# Patient Record
Sex: Male | Born: 1955 | Race: White | Hispanic: No | Marital: Married | Smoking: Never smoker
Health system: Southern US, Community
[De-identification: ages and names within clinical notes are randomized; demographics above are authoritative.]

## PROBLEM LIST (undated history)

## (undated) DIAGNOSIS — Z79899 Other long term (current) drug therapy: Secondary | ICD-10-CM

## (undated) DIAGNOSIS — G629 Polyneuropathy, unspecified: Secondary | ICD-10-CM

## (undated) DIAGNOSIS — M150 Primary generalized (osteo)arthritis: Secondary | ICD-10-CM

## (undated) DIAGNOSIS — M48061 Spinal stenosis, lumbar region without neurogenic claudication: Secondary | ICD-10-CM

## (undated) DIAGNOSIS — R5383 Other fatigue: Secondary | ICD-10-CM

## (undated) DIAGNOSIS — D72829 Elevated white blood cell count, unspecified: Secondary | ICD-10-CM

## (undated) DIAGNOSIS — Z6841 Body Mass Index (BMI) 40.0 and over, adult: Secondary | ICD-10-CM

## (undated) DIAGNOSIS — R7309 Other abnormal glucose: Secondary | ICD-10-CM

## (undated) DIAGNOSIS — I2510 Atherosclerotic heart disease of native coronary artery without angina pectoris: Principal | ICD-10-CM

## (undated) DIAGNOSIS — M1A9XX1 Chronic gout, unspecified, with tophus (tophi): Secondary | ICD-10-CM

## (undated) DIAGNOSIS — K828 Other specified diseases of gallbladder: Secondary | ICD-10-CM

## (undated) DIAGNOSIS — R945 Abnormal results of liver function studies: Secondary | ICD-10-CM

## (undated) DIAGNOSIS — R5381 Other malaise: Secondary | ICD-10-CM

## (undated) DIAGNOSIS — E782 Mixed hyperlipidemia: Secondary | ICD-10-CM

## (undated) DIAGNOSIS — N400 Enlarged prostate without lower urinary tract symptoms: Secondary | ICD-10-CM

## (undated) DIAGNOSIS — K2970 Gastritis, unspecified, without bleeding: Secondary | ICD-10-CM

## (undated) DIAGNOSIS — N181 Chronic kidney disease, stage 1: Secondary | ICD-10-CM

## (undated) DIAGNOSIS — I10 Essential (primary) hypertension: Secondary | ICD-10-CM

## (undated) DIAGNOSIS — Q248 Other specified congenital malformations of heart: Secondary | ICD-10-CM

## (undated) DIAGNOSIS — M1990 Unspecified osteoarthritis, unspecified site: Secondary | ICD-10-CM

## (undated) DIAGNOSIS — K2210 Ulcer of esophagus without bleeding: Secondary | ICD-10-CM

## (undated) HISTORY — DX: Other specified congenital malformations of heart: Q24.8

## (undated) HISTORY — PX: WRIST SURGERY: SHX841

## (undated) HISTORY — DX: Other specified diseases of gallbladder: K82.8

## (undated) HISTORY — DX: Morbid (severe) obesity due to excess calories: E66.01

## (undated) HISTORY — DX: Ulcer of esophagus without bleeding: K22.10

## (undated) HISTORY — DX: Other abnormal glucose: R73.09

## (undated) HISTORY — DX: Body Mass Index (BMI) 40.0 and over, adult: Z684

## (undated) HISTORY — DX: Essential (primary) hypertension: I10

## (undated) HISTORY — DX: Other long term (current) drug therapy: Z79.899

## (undated) HISTORY — DX: Spinal stenosis, lumbar region without neurogenic claudication: M48.061

## (undated) HISTORY — DX: Atherosclerotic heart disease of native coronary artery without angina pectoris: I25.10

## (undated) HISTORY — DX: Other malaise: R53.81

## (undated) HISTORY — DX: Gastritis, unspecified, without bleeding: K29.70

## (undated) HISTORY — DX: Abnormal results of liver function studies: R94.5

## (undated) HISTORY — PX: CARPAL TUNNEL RELEASE: SHX101

## (undated) HISTORY — DX: Elevated white blood cell count, unspecified: D72.829

## (undated) HISTORY — DX: Benign prostatic hyperplasia without lower urinary tract symptoms: N40.0

## (undated) HISTORY — DX: Chronic gout, unspecified, with tophus (tophi): M1A.9XX1

## (undated) HISTORY — PX: CATARACT EXTRACTION W/ INTRAOCULAR LENS  IMPLANT, BILATERAL: SHX1307

## (undated) HISTORY — DX: Primary generalized (osteo)arthritis: M15.0

## (undated) HISTORY — DX: Mixed hyperlipidemia: E78.2

## (undated) HISTORY — DX: Other fatigue: R53.83

## (undated) HISTORY — PX: KNEE ARTHROSCOPY: SUR90

## (undated) HISTORY — DX: Chronic kidney disease, stage 1: N18.1

## (undated) HISTORY — DX: Polyneuropathy, unspecified: G62.9

## (undated) HISTORY — PX: TONSILLECTOMY: SUR1361

## (undated) HISTORY — PX: BACK SURGERY: SHX140

---

## 2012-08-22 ENCOUNTER — Other Ambulatory Visit: Payer: Self-pay | Admitting: Neurosurgery

## 2012-08-28 ENCOUNTER — Encounter (HOSPITAL_COMMUNITY)
Admission: RE | Admit: 2012-08-28 | Discharge: 2012-08-28 | Disposition: A | Discharge status: Home / Self Care | Payer: BC Managed Care – PPO | Source: Ambulatory Visit | Attending: Neurosurgery | Admitting: Neurosurgery

## 2012-08-28 ENCOUNTER — Encounter (HOSPITAL_COMMUNITY): Payer: Self-pay

## 2012-08-28 ENCOUNTER — Encounter (HOSPITAL_COMMUNITY)
Admission: RE | Admit: 2012-08-28 | Discharge: 2012-08-28 | Disposition: A | Discharge status: Home / Self Care | Payer: BC Managed Care – PPO | Source: Ambulatory Visit | Attending: Anesthesiology | Admitting: Anesthesiology

## 2012-08-28 HISTORY — DX: Unspecified osteoarthritis, unspecified site: M19.90

## 2012-08-28 LAB — CBC WITH DIFFERENTIAL/PLATELET
Basophils Absolute: 0 10*3/uL (ref 0.0–0.1)
Basophils Relative: 0 % (ref 0–1)
HCT: 52.5 % — ABNORMAL HIGH (ref 39.0–52.0)
Hemoglobin: 18.4 g/dL — ABNORMAL HIGH (ref 13.0–17.0)
Lymphocytes Relative: 24 % (ref 12–46)
MCHC: 35 g/dL (ref 30.0–36.0)
Monocytes Absolute: 0.6 10*3/uL (ref 0.1–1.0)
Monocytes Relative: 7 % (ref 3–12)
Neutro Abs: 5.5 10*3/uL (ref 1.7–7.7)
Neutrophils Relative %: 66 % (ref 43–77)
RBC: 5.7 MIL/uL (ref 4.22–5.81)
WBC: 8.4 10*3/uL (ref 4.0–10.5)

## 2012-08-28 LAB — BASIC METABOLIC PANEL
BUN: 22 mg/dL (ref 6–23)
CO2: 27 mEq/L (ref 19–32)
Chloride: 102 mEq/L (ref 96–112)
Glucose, Bld: 102 mg/dL — ABNORMAL HIGH (ref 70–99)
Potassium: 4.4 mEq/L (ref 3.5–5.1)
Sodium: 138 mEq/L (ref 135–145)

## 2012-08-28 LAB — TYPE AND SCREEN: Antibody Screen: NEGATIVE

## 2012-08-28 NOTE — Pre-Procedure Instructions (Signed)
[name]  08/28/2012   Your procedure is scheduled on: Monday  09/03/12   Report to [facility] at 530 AM.  Call this number if you have problems the morning of surgery: [phone]   Remember:   Do not eat food  Or drink :After Midnight.    Take these medicines the morning of surgery with A SIP OF WATER:  Allopurinol, colchicine   Do not wear jewelry, make-up or nail polish.  Do not wear lotions, powders, or perfumes. You may wear deodorant.  Do not shave 48 hours prior to surgery. Men may shave face and neck.  Do not bring valuables to the hospital.  Contacts, dentures or bridgework may not be worn into surgery.  Leave suitcase in the car. After surgery it may be brought to your room.  For patients admitted to the hospital, checkout time is 11:00 AM the day of discharge.   Patients discharged the day of surgery will not be allowed to drive home.  Name and phone number of your driver:   Special Instructions: Shower using CHG 2 nights before surgery and the night before surgery.  If you shower the day of surgery use CHG.  Use special wash - you have one bottle of CHG for all showers.  You should use approximately 1/3 of the bottle for each shower.   Please read over the following fact sheets that you were given: Pain Booklet, Coughing and Deep Breathing, Blood Transfusion Information, MRSA Information and Surgical Site Infection Prevention

## 2012-08-28 NOTE — Progress Notes (Signed)
FAXED APNEA FORM TO PATIENT'S PCP

## 2012-08-28 NOTE — Progress Notes (Signed)
[facility] TO REVIEW LABS. 

## 2012-08-29 NOTE — Consult Note (Signed)
Anesthesia chart review: Patient is a 56 year old male scheduled for L3-4 decompressive lumbar laminectomy by Dr. Jordan Likes on 09/03/2012. History includes nonsmoker, obesity (BMI 40), HTN, arthritis.   Labs noted.  H/H 18.4/52.5.  PLT 182.  Cr 1.28, glucose 102.  Chest x-ray on 08/28/2012 showed no acute cardiopulmonary disease.  EKG on 08/28/2012 showed normal sinus rhythm.  His OSA screening tool was 5 (> 4 indicates an elevated risk.)  This form was faxed to his PCP at [facility] in [place] by his PAT RN.  Shonna Chock, PA-C

## 2012-09-02 MED ORDER — DEXTROSE 5 % IV SOLN
3.0000 g | INTRAVENOUS | Status: AC
  Administered 2012-09-03: 3 g via INTRAVENOUS
  Filled 2012-09-02 (×2): qty 3000

## 2012-09-02 MED ORDER — CEFAZOLIN SODIUM-DEXTROSE 2-3 GM-% IV SOLR: 2.0000 g | INTRAVENOUS | Status: DC

## 2012-09-03 ENCOUNTER — Ambulatory Visit (HOSPITAL_COMMUNITY): Payer: BC Managed Care – PPO | Admitting: Vascular Surgery

## 2012-09-03 ENCOUNTER — Ambulatory Visit (HOSPITAL_COMMUNITY)
Admission: RE | Admit: 2012-09-03 | Discharge: 2012-09-03 | Disposition: A | Discharge status: Home / Self Care | Payer: BC Managed Care – PPO | Source: Ambulatory Visit | Attending: Neurosurgery | Admitting: Neurosurgery

## 2012-09-03 ENCOUNTER — Encounter (HOSPITAL_COMMUNITY): Payer: Self-pay | Admitting: *Deleted

## 2012-09-03 ENCOUNTER — Encounter (HOSPITAL_COMMUNITY)
Admission: RE | Disposition: A | Discharge status: Home / Self Care | Payer: Self-pay | Source: Ambulatory Visit | Attending: Neurosurgery

## 2012-09-03 ENCOUNTER — Ambulatory Visit (HOSPITAL_COMMUNITY): Payer: BC Managed Care – PPO

## 2012-09-03 ENCOUNTER — Encounter (HOSPITAL_COMMUNITY): Payer: Self-pay | Admitting: Vascular Surgery

## 2012-09-03 ENCOUNTER — Encounter (HOSPITAL_COMMUNITY): Payer: Self-pay | Admitting: Neurosurgery

## 2012-09-03 DIAGNOSIS — Z01810 Encounter for preprocedural cardiovascular examination: Secondary | ICD-10-CM | POA: Insufficient documentation

## 2012-09-03 DIAGNOSIS — M48061 Spinal stenosis, lumbar region without neurogenic claudication: Secondary | ICD-10-CM | POA: Diagnosis present

## 2012-09-03 DIAGNOSIS — Z01812 Encounter for preprocedural laboratory examination: Secondary | ICD-10-CM | POA: Insufficient documentation

## 2012-09-03 DIAGNOSIS — M1990 Unspecified osteoarthritis, unspecified site: Secondary | ICD-10-CM | POA: Insufficient documentation

## 2012-09-03 DIAGNOSIS — I10 Essential (primary) hypertension: Secondary | ICD-10-CM | POA: Insufficient documentation

## 2012-09-03 DIAGNOSIS — M48062 Spinal stenosis, lumbar region with neurogenic claudication: Secondary | ICD-10-CM | POA: Insufficient documentation

## 2012-09-03 HISTORY — DX: Spinal stenosis, lumbar region without neurogenic claudication: M48.061

## 2012-09-03 HISTORY — PX: LUMBAR LAMINECTOMY/DECOMPRESSION MICRODISCECTOMY: SHX5026

## 2012-09-03 SURGERY — LUMBAR LAMINECTOMY/DECOMPRESSION MICRODISCECTOMY 1 LEVEL
Anesthesia: General | Site: Back | Wound class: Clean

## 2012-09-03 MED ORDER — PHENYLEPHRINE HCL 10 MG/ML IJ SOLN
10.0000 mg | INTRAVENOUS | Status: DC | PRN
  Administered 2012-09-03: 25 ug/min via INTRAVENOUS

## 2012-09-03 MED ORDER — KETOROLAC TROMETHAMINE 30 MG/ML IJ SOLN
INTRAMUSCULAR | Status: DC | PRN
  Administered 2012-09-03: 30 mg via INTRAVENOUS

## 2012-09-03 MED ORDER — DEXAMETHASONE SODIUM PHOSPHATE 10 MG/ML IJ SOLN
INTRAMUSCULAR | Status: DC | PRN
  Administered 2012-09-03: 8 mg via INTRAVENOUS

## 2012-09-03 MED ORDER — HYDROMORPHONE HCL PF 1 MG/ML IJ SOLN
INTRAMUSCULAR | Status: AC
  Filled 2012-09-03: qty 1

## 2012-09-03 MED ORDER — MENTHOL 3 MG MT LOZG: 1.0000 | LOZENGE | OROMUCOSAL | Status: DC | PRN

## 2012-09-03 MED ORDER — LIDOCAINE HCL 4 % MT SOLN
OROMUCOSAL | Status: DC | PRN
  Administered 2012-09-03: 4 mL via TOPICAL

## 2012-09-03 MED ORDER — PROMETHAZINE HCL 25 MG/ML IJ SOLN: 6.2500 mg | INTRAMUSCULAR | Status: DC | PRN

## 2012-09-03 MED ORDER — COLCHICINE 0.6 MG PO TABS: 0.6000 mg | ORAL_TABLET | Freq: Every day | ORAL | Status: DC

## 2012-09-03 MED ORDER — HYDROMORPHONE HCL PF 1 MG/ML IJ SOLN
0.2500 mg | INTRAMUSCULAR | Status: DC | PRN
  Administered 2012-09-03: 0.5 mg via INTRAVENOUS

## 2012-09-03 MED ORDER — 0.9 % SODIUM CHLORIDE (POUR BTL) OPTIME
TOPICAL | Status: DC | PRN
  Administered 2012-09-03: 1000 mL

## 2012-09-03 MED ORDER — ACETAMINOPHEN 650 MG RE SUPP: 650.0000 mg | RECTAL | Status: DC | PRN

## 2012-09-03 MED ORDER — CEFAZOLIN SODIUM 1-5 GM-% IV SOLN
1.0000 g | Freq: Three times a day (TID) | INTRAVENOUS | Status: DC
  Administered 2012-09-03: 1 g via INTRAVENOUS
  Filled 2012-09-03 (×2): qty 50

## 2012-09-03 MED ORDER — HYDROCODONE-ACETAMINOPHEN 5-325 MG PO TABS: 1.0000 | ORAL_TABLET | ORAL | Status: DC | PRN

## 2012-09-03 MED ORDER — LACTATED RINGERS IV SOLN
INTRAVENOUS | Status: DC | PRN
  Administered 2012-09-03 (×2): via INTRAVENOUS

## 2012-09-03 MED ORDER — MIDAZOLAM HCL 5 MG/5ML IJ SOLN
INTRAMUSCULAR | Status: DC | PRN
  Administered 2012-09-03: 2 mg via INTRAVENOUS

## 2012-09-03 MED ORDER — NEOSTIGMINE METHYLSULFATE 1 MG/ML IJ SOLN
INTRAMUSCULAR | Status: DC | PRN
  Administered 2012-09-03: 3 mg via INTRAVENOUS

## 2012-09-03 MED ORDER — GLYCOPYRROLATE 0.2 MG/ML IJ SOLN
INTRAMUSCULAR | Status: DC | PRN
  Administered 2012-09-03: 0.4 mg via INTRAVENOUS

## 2012-09-03 MED ORDER — PHENOL 1.4 % MT LIQD: 1.0000 | OROMUCOSAL | Status: DC | PRN

## 2012-09-03 MED ORDER — PROPOFOL 10 MG/ML IV BOLUS
INTRAVENOUS | Status: DC | PRN
  Administered 2012-09-03: 200 mg via INTRAVENOUS

## 2012-09-03 MED ORDER — SENNA 8.6 MG PO TABS: 1.0000 | ORAL_TABLET | Freq: Two times a day (BID) | ORAL | Status: DC

## 2012-09-03 MED ORDER — DEXTROSE 5 % IV SOLN
INTRAVENOUS | Status: DC | PRN
  Administered 2012-09-03: 08:00:00 via INTRAVENOUS

## 2012-09-03 MED ORDER — ARTIFICIAL TEARS OP OINT
TOPICAL_OINTMENT | OPHTHALMIC | Status: DC | PRN
  Administered 2012-09-03: 1 via OPHTHALMIC

## 2012-09-03 MED ORDER — ROCURONIUM BROMIDE 100 MG/10ML IV SOLN
INTRAVENOUS | Status: DC | PRN
  Administered 2012-09-03: 20 mg via INTRAVENOUS
  Administered 2012-09-03: 50 mg via INTRAVENOUS
  Administered 2012-09-03: 5 mg via INTRAVENOUS

## 2012-09-03 MED ORDER — FENTANYL CITRATE 0.05 MG/ML IJ SOLN
INTRAMUSCULAR | Status: DC | PRN
  Administered 2012-09-03: 150 ug via INTRAVENOUS

## 2012-09-03 MED ORDER — DEXAMETHASONE SODIUM PHOSPHATE 10 MG/ML IJ SOLN: 10.0000 mg | INTRAMUSCULAR | Status: DC

## 2012-09-03 MED ORDER — SODIUM CHLORIDE 0.9 % IJ SOLN: 3.0000 mL | INTRAMUSCULAR | Status: DC | PRN

## 2012-09-03 MED ORDER — ALUM & MAG HYDROXIDE-SIMETH 200-200-20 MG/5ML PO SUSP: 30.0000 mL | Freq: Four times a day (QID) | ORAL | Status: DC | PRN

## 2012-09-03 MED ORDER — HYDROCODONE-ACETAMINOPHEN 5-325 MG PO TABS
1.0000 | ORAL_TABLET | ORAL | Status: DC | PRN
  Administered 2012-09-03: 2 via ORAL
  Filled 2012-09-03: qty 2

## 2012-09-03 MED ORDER — CYCLOBENZAPRINE HCL 10 MG PO TABS: 10.0000 mg | ORAL_TABLET | Freq: Three times a day (TID) | ORAL | Status: DC | PRN

## 2012-09-03 MED ORDER — SODIUM CHLORIDE 0.9 % IR SOLN
Status: DC | PRN
  Administered 2012-09-03: 07:00:00

## 2012-09-03 MED ORDER — CYCLOBENZAPRINE HCL 10 MG PO TABS
10.0000 mg | ORAL_TABLET | Freq: Three times a day (TID) | ORAL | Status: DC | PRN
  Filled 2012-09-03: qty 1

## 2012-09-03 MED ORDER — ONDANSETRON HCL 4 MG/2ML IJ SOLN
4.0000 mg | INTRAMUSCULAR | Status: DC | PRN
  Filled 2012-09-03: qty 2

## 2012-09-03 MED ORDER — HYDROMORPHONE HCL PF 1 MG/ML IJ SOLN
0.5000 mg | INTRAMUSCULAR | Status: DC | PRN
  Administered 2012-09-03: 1 mg via INTRAVENOUS
  Filled 2012-09-03: qty 1

## 2012-09-03 MED ORDER — BUPIVACAINE HCL (PF) 0.25 % IJ SOLN
INTRAMUSCULAR | Status: DC | PRN
  Administered 2012-09-03: 20 mL

## 2012-09-03 MED ORDER — SODIUM CHLORIDE 0.9 % IJ SOLN
3.0000 mL | Freq: Two times a day (BID) | INTRAMUSCULAR | Status: DC
  Administered 2012-09-03: 3 mL via INTRAVENOUS

## 2012-09-03 MED ORDER — ACETAMINOPHEN 325 MG PO TABS: 650.0000 mg | ORAL_TABLET | ORAL | Status: DC | PRN

## 2012-09-03 MED ORDER — KETOROLAC TROMETHAMINE 30 MG/ML IJ SOLN
30.0000 mg | Freq: Four times a day (QID) | INTRAMUSCULAR | Status: DC
  Administered 2012-09-03: 30 mg via INTRAVENOUS
  Filled 2012-09-03: qty 1

## 2012-09-03 MED ORDER — ONDANSETRON HCL 4 MG/2ML IJ SOLN
INTRAMUSCULAR | Status: DC | PRN
  Administered 2012-09-03: 4 mg via INTRAVENOUS

## 2012-09-03 MED ORDER — THROMBIN 5000 UNITS EX SOLR
CUTANEOUS | Status: DC | PRN
  Administered 2012-09-03 (×2): 5000 [IU] via TOPICAL

## 2012-09-03 MED ORDER — PHENYLEPHRINE HCL 10 MG/ML IJ SOLN
INTRAMUSCULAR | Status: DC | PRN
  Administered 2012-09-03: 80 ug via INTRAVENOUS
  Administered 2012-09-03 (×4): 120 ug via INTRAVENOUS

## 2012-09-03 MED ORDER — LISINOPRIL 40 MG PO TABS
40.0000 mg | ORAL_TABLET | Freq: Every day | ORAL | Status: DC
  Administered 2012-09-03: 40 mg via ORAL
  Filled 2012-09-03: qty 1

## 2012-09-03 MED ORDER — ALLOPURINOL 300 MG PO TABS: 600.0000 mg | ORAL_TABLET | Freq: Every day | ORAL | Status: DC

## 2012-09-03 MED ORDER — SODIUM CHLORIDE 0.9 % IV SOLN
250.0000 mL | INTRAVENOUS | Status: DC
  Administered 2012-09-03: 250 mL via INTRAVENOUS

## 2012-09-03 MED ORDER — ZOLPIDEM TARTRATE 5 MG PO TABS: 5.0000 mg | ORAL_TABLET | Freq: Every evening | ORAL | Status: DC | PRN

## 2012-09-03 MED ORDER — HEMOSTATIC AGENTS (NO CHARGE) OPTIME
TOPICAL | Status: DC | PRN
  Administered 2012-09-03: 1 via TOPICAL

## 2012-09-03 SURGICAL SUPPLY — 48 items
BAG DECANTER FOR FLEXI CONT (MISCELLANEOUS) ×2 IMPLANT
BENZOIN TINCTURE PRP APPL 2/3 (GAUZE/BANDAGES/DRESSINGS) ×2 IMPLANT
BLADE SURG ROTATE 9660 (MISCELLANEOUS) IMPLANT
BRUSH SCRUB EZ PLAIN DRY (MISCELLANEOUS) ×2 IMPLANT
BUR CUTTER 7.0 ROUND (BURR) ×2 IMPLANT
CANISTER SUCTION 2500CC (MISCELLANEOUS) ×2 IMPLANT
CLOTH BEACON ORANGE TIMEOUT ST (SAFETY) ×2 IMPLANT
CONT SPEC 4OZ CLIKSEAL STRL BL (MISCELLANEOUS) ×2 IMPLANT
DECANTER SPIKE VIAL GLASS SM (MISCELLANEOUS) ×2 IMPLANT
DERMABOND ADVANCED (GAUZE/BANDAGES/DRESSINGS) ×1
DERMABOND ADVANCED .7 DNX12 (GAUZE/BANDAGES/DRESSINGS) ×1 IMPLANT
DRAPE LAPAROTOMY 100X72X124 (DRAPES) ×2 IMPLANT
DRAPE MICROSCOPE ZEISS OPMI (DRAPES) ×2 IMPLANT
DRAPE POUCH INSTRU U-SHP 10X18 (DRAPES) ×2 IMPLANT
DRAPE PROXIMA HALF (DRAPES) IMPLANT
DRAPE SURG 17X23 STRL (DRAPES) ×4 IMPLANT
ELECT REM PT RETURN 9FT ADLT (ELECTROSURGICAL) ×2
ELECTRODE REM PT RTRN 9FT ADLT (ELECTROSURGICAL) ×1 IMPLANT
EVACUATOR 1/8 PVC DRAIN (DRAIN) ×2 IMPLANT
GAUZE SPONGE 4X4 16PLY XRAY LF (GAUZE/BANDAGES/DRESSINGS) IMPLANT
GLOVE BIOGEL PI IND STRL 7.0 (GLOVE) ×1 IMPLANT
GLOVE BIOGEL PI INDICATOR 7.0 (GLOVE) ×1
GLOVE ECLIPSE 8.5 STRL (GLOVE) ×2 IMPLANT
GLOVE EXAM NITRILE LRG STRL (GLOVE) IMPLANT
GLOVE EXAM NITRILE MD LF STRL (GLOVE) ×2 IMPLANT
GLOVE EXAM NITRILE XL STR (GLOVE) IMPLANT
GLOVE EXAM NITRILE XS STR PU (GLOVE) IMPLANT
GLOVE OPTIFIT SS 6.5 STRL BRWN (GLOVE) ×6 IMPLANT
GOWN BRE IMP SLV AUR LG STRL (GOWN DISPOSABLE) ×2 IMPLANT
GOWN BRE IMP SLV AUR XL STRL (GOWN DISPOSABLE) ×2 IMPLANT
GOWN STRL REIN 2XL LVL4 (GOWN DISPOSABLE) IMPLANT
KIT BASIN OR (CUSTOM PROCEDURE TRAY) ×2 IMPLANT
KIT ROOM TURNOVER OR (KITS) ×2 IMPLANT
NEEDLE HYPO 22GX1.5 SAFETY (NEEDLE) ×2 IMPLANT
NEEDLE SPNL 22GX3.5 QUINCKE BK (NEEDLE) ×2 IMPLANT
NS IRRIG 1000ML POUR BTL (IV SOLUTION) ×2 IMPLANT
PACK LAMINECTOMY NEURO (CUSTOM PROCEDURE TRAY) ×2 IMPLANT
PAD ARMBOARD 7.5X6 YLW CONV (MISCELLANEOUS) ×6 IMPLANT
RUBBERBAND STERILE (MISCELLANEOUS) ×4 IMPLANT
SPONGE GAUZE 4X4 12PLY (GAUZE/BANDAGES/DRESSINGS) ×2 IMPLANT
SPONGE SURGIFOAM ABS GEL SZ50 (HEMOSTASIS) ×2 IMPLANT
STRIP CLOSURE SKIN 1/2X4 (GAUZE/BANDAGES/DRESSINGS) ×2 IMPLANT
SUT VIC AB 2-0 CT1 18 (SUTURE) ×2 IMPLANT
SUT VIC AB 3-0 SH 8-18 (SUTURE) ×2 IMPLANT
SYR 20ML ECCENTRIC (SYRINGE) ×2 IMPLANT
TOWEL OR 17X24 6PK STRL BLUE (TOWEL DISPOSABLE) ×2 IMPLANT
TOWEL OR 17X26 10 PK STRL BLUE (TOWEL DISPOSABLE) ×2 IMPLANT
WATER STERILE IRR 1000ML POUR (IV SOLUTION) ×2 IMPLANT

## 2012-09-03 NOTE — Op Note (Signed)
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  Asst. Surgeon: None  Anesthesia: General  Indication: 56 year old male with back and bilateral lower chamois pain consistent with neurogenic claudication failing conservative measure workup demonstrates evidence of significant stenosis at L3-4 secondary to spondylosis. Patient presents now for L3 for decompressive surgery in hopes of improving his symptoms.  Operative note: After induction anesthesia patient positioned prone on the Wilson frame and appropriate padded. Lumbar region prepped and draped. Incision made and dissection carried down in a subperiosteal fashion exposing what was felt to be the L3-4 level. X-rays taken in fact the L4-5 level was exposed. Dissection was redirected 1 level cephalad. Retractor was placed. Bilateral decompressive laminotomies and foraminotomies were then performed using the high-speed drill and Kerrison rongeurs to remove the inferior aspect of lamina of L3 medial aspect the L3-4 facet joint superior rim of L4 l flavum was elevated and resected so fashion. Wide decompressive foraminotomies and portal along the course exiting L3 and L4 nerve roots bilaterally. At this point a very thorough decompression been achieved. There is no his injury to thecal sac and nerve roots. Wound is then irrigated and bike solution. Gelfoam was placed topically for hemostasis. A medium Hemovac drain was left in the epidural space. Wounds and close in layers with Vicryl sutures. Steri-Strips sterile dressing were applied. There were no apparent complications. Patient tolerated the procedure well and returned to the recovery room postop.

## 2012-09-03 NOTE — Anesthesia Preprocedure Evaluation (Addendum)
Anesthesia Evaluation  Patient identified by MRN, date of birth, ID band Patient awake    Reviewed: Allergy & Precautions, H&P , NPO status , Patient's Chart, lab work & pertinent test results  Airway Mallampati: III TM Distance: >3 FB Neck ROM: Full    Dental  (+) Teeth Intact and Dental Advisory Given   Pulmonary neg pulmonary ROS,   Findings: Lungs are hypoinflated but clear.  Cardiomediastinal silhouette is within normal.  There is spondylosis of the spine.   IMPRESSION: No acute cardiopulmonary disease.  08/28/12   Pulmonary exam normal       Cardiovascular Exercise Tolerance: Good hypertension, Pt. on medications Rhythm:Regular Rate:Normal  28-Aug-2012 08:57:03  [facility] ROUTINE RECORD Normal sinus rhythm Normal ECG No previous tracing   Neuro/Psych negative neurological ROS  negative psych ROS   GI/Hepatic negative GI ROS, Neg liver ROS,   Endo/Other  negative endocrine ROSMorbid obesity  Renal/GU negative Renal ROS     Musculoskeletal  (+) Arthritis -, Osteoarthritis,    Abdominal   Peds  Hematology negative hematology ROS (+)   Anesthesia Other Findings Thick Neck.  Morbidly Obese  Reproductive/Obstetrics                       Anesthesia Physical Anesthesia Plan  ASA: III  Anesthesia Plan: General   Post-op Pain Management:    Induction: Intravenous  Airway Management Planned: Oral ETT  Additional Equipment:   Intra-op Plan:   Post-operative Plan: Extubation in OR  Informed Consent: I have reviewed the patients History and Physical, chart, labs and discussed the procedure including the risks, benefits and alternatives for the proposed anesthesia with the patient or authorized representative who has indicated his/her understanding and acceptance.   Dental advisory given  Plan Discussed with: CRNA, Anesthesiologist and Surgeon  Anesthesia Plan  Comments:         Anesthesia Quick Evaluation

## 2012-09-03 NOTE — Discharge Summary (Signed)
Physician Discharge Summary  Patient ID: Miguel Hamilton MRN: [id number] DOB/AGE: 1956/05/17 56 y.o.  Admit date: 09/03/2012 Discharge date: 09/03/2012  Admission Diagnoses:  Discharge Diagnoses:  Principal Problem:  *Lumbar stenosis with neurogenic claudication   Discharged Condition: good  Hospital Course: Admitted to the hospital where he underwent uncomplicated bilateral L3-4 decompressive laminotomies and foraminotomies done very well. Back and lower extremity pain are much improved. Strength and sensation stable . Wound is healing well. Plan for discharge home.  Consults:   Significant Diagnostic Studies:   Treatments:   Discharge Exam: Blood pressure 130/83, pulse 83, temperature 97.7 F (36.5 C), temperature source Oral, resp. rate 18, SpO2 96.00%. Awake and alert oriented appropriate her cranial nerve function is intact. Motor examination with chronic right-sided dorsiflexion weakness which is improved from preop. Sensory examination stable. Wound clean dry and intact. Chest and abdomen benign.  Disposition: Final discharge disposition not confirmed     Medication List     As of 09/03/2012  5:32 PM    TAKE these medications         allopurinol 300 MG tablet   Commonly known as: ZYLOPRIM   Take 600 mg by mouth daily.      colchicine 0.6 MG tablet   Take 0.6 mg by mouth daily.      cyclobenzaprine 10 MG tablet   Commonly known as: FLEXERIL   Take 1 tablet (10 mg total) by mouth 3 (three) times daily as needed for muscle spasms.      HYDROcodone-acetaminophen 5-325 MG per tablet   Commonly known as: NORCO/VICODIN   Take 1-2 tablets by mouth every 4 (four) hours as needed.      lisinopril 40 MG tablet   Commonly known as: PRINIVIL,ZESTRIL   Take 40 mg by mouth daily.      MAGNESIUM PO   Take 1,000 mg by mouth daily.      naproxen 500 MG tablet   Commonly known as: NAPROSYN   Take 500 mg by mouth 2 (two) times daily with a meal.      vitamin B-12 1000  MCG tablet   Commonly known as: CYANOCOBALAMIN   Take 1,000 mcg by mouth daily.           Follow-up Information    Follow up with Miamarie Moll A, MD. Call in 1 week. (Ask for Lurena Joiner)    Contact information:   [address] [place] [address] [phone]          Signed: Temple Pacini 09/03/2012, 5:32 PM

## 2012-09-03 NOTE — Preoperative (Signed)
Beta Blockers   Reason not to administer Beta Blockers:Not Applicable 

## 2012-09-03 NOTE — H&P (Signed)
Miguel Hamilton is an 56 y.o. male.   Chief Complaint: Back and bilateral leg pain HPI: 56 year old male with severe back and bilateral lower trimming pain consistent with neurogenic claudication presents for lumbar decompressive surgery. Symptoms have been progressive over time. He denies any weakness. Having no bowel or bladder dysfunction. Workup demonstrates evidence of marked stenosis at L3-4. Remaining levels are reasonably open and patent.  Past Medical History  Diagnosis Date  . Hypertension   . Arthritis     gout    Past Surgical History  Procedure Date  . Knee arthroscopy     left   . Carpal tunnel release     right   . Wrist surgery     left   . Back surgery     1982  . Tonsillectomy   . Cataract extraction w/ intraocular lens  implant, bilateral     History reviewed. No pertinent family history. Social History:  reports that he has never smoked. He does not have any smokeless tobacco history on file. He reports that he does not drink alcohol or use illicit drugs.  Allergies:  Allergies  Allergen Reactions  . Percocet (Oxycodone-Acetaminophen) Hives  . Sulfa Antibiotics     Medications Prior to Admission  Medication Sig Dispense Refill  . allopurinol (ZYLOPRIM) 300 MG tablet Take 600 mg by mouth daily.      . colchicine 0.6 MG tablet Take 0.6 mg by mouth daily.      Marland Kitchen lisinopril (PRINIVIL,ZESTRIL) 40 MG tablet Take 40 mg by mouth daily.      Marland Kitchen MAGNESIUM PO Take 1,000 mg by mouth daily.      . vitamin B-12 (CYANOCOBALAMIN) 1000 MCG tablet Take 1,000 mcg by mouth daily.      . naproxen (NAPROSYN) 500 MG tablet Take 500 mg by mouth 2 (two) times daily with a meal.        No results found for this or any previous visit (from the past 48 hour(s)). No results found.  Review of Systems  Constitutional: Negative.   HENT: Negative.   Eyes: Negative.   Respiratory: Negative.   Cardiovascular: Negative.   Gastrointestinal: Negative.   Genitourinary: Negative.     Musculoskeletal: Negative.   Skin: Negative.   Neurological: Negative.   Endo/Heme/Allergies: Negative.   Psychiatric/Behavioral: Negative.     Blood pressure 118/83, pulse 81, temperature 98 F (36.7 C), temperature source Oral, resp. rate 18, SpO2 96.00%. Physical Exam  Constitutional: He is oriented to person, place, and time. He appears well-developed and well-nourished. No distress.  HENT:  Head: Normocephalic and atraumatic.  Right Ear: External ear normal.  Left Ear: External ear normal.  Nose: Nose normal.  Mouth/Throat: Oropharynx is clear and moist. No oropharyngeal exudate.  Eyes: Conjunctivae normal and EOM are normal. Pupils are equal, round, and reactive to light. Right eye exhibits no discharge. Left eye exhibits no discharge.  Neck: Normal range of motion. Neck supple. No tracheal deviation present. No thyromegaly present.  Cardiovascular: Normal rate, regular rhythm, normal heart sounds and intact distal pulses.   Respiratory: Effort normal and breath sounds normal. No respiratory distress. He has no wheezes.  GI: Soft. Bowel sounds are normal. He exhibits no distension. There is no tenderness.  Musculoskeletal: Normal range of motion. He exhibits no edema and no tenderness.  Neurological: He is alert and oriented to person, place, and time. He has normal reflexes. He displays normal reflexes. No cranial nerve deficit. He exhibits normal muscle tone. Coordination  normal.  Skin: Skin is warm and dry. No rash noted. He is not diaphoretic. No erythema. No pallor.  Psychiatric: He has a normal mood and affect. His behavior is normal. Judgment and thought content normal.     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 Plan L3-4 decompressive laminectomy with bilateral L3 and L4 decompressive foraminotomies. Risks and benefits been explained. Patient wishes to proceed.  Miguel Hamilton A 09/03/2012, 7:31 AM

## 2012-09-03 NOTE — Transfer of Care (Signed)
Immediate Anesthesia Transfer of Care Note  Patient: Miguel Hamilton  Procedure(s) Performed: Procedure(s) (LRB) with comments: LUMBAR LAMINECTOMY/DECOMPRESSION MICRODISCECTOMY 1 LEVEL (N/A) - lumbar three-four laminectomy decompression microdiscectomy  Patient Location: PACU  Anesthesia Type:General  Level of Consciousness: awake, alert , oriented and patient cooperative  Airway & Oxygen Therapy: Patient Spontanous Breathing and Patient connected to nasal cannula oxygen  Post-op Assessment: Report given to PACU RN and Post -op Vital signs reviewed and stable  Post vital signs: Reviewed and stable  Complications: No apparent anesthesia complications

## 2012-09-03 NOTE — Anesthesia Procedure Notes (Signed)
Procedure Name: Intubation Date/Time: 09/03/2012 7:43 AM Performed by: Tyrone Nine Pre-anesthesia Checklist: Patient identified, Timeout performed, Emergency Drugs available, Suction available and Patient being monitored Patient Re-evaluated:Patient Re-evaluated prior to inductionOxygen Delivery Method: Circle system utilized Preoxygenation: Pre-oxygenation with 100% oxygen Intubation Type: IV induction Ventilation: Mask ventilation without difficulty and Oral airway inserted - appropriate to patient size Laryngoscope Size: Mac and 3 Grade View: Grade I Tube type: Oral Tube size: 8.0 mm Number of attempts: 1 Airway Equipment and Method: Stylet

## 2012-09-03 NOTE — Plan of Care (Signed)
Problem: Consults Goal: Diagnosis - Spinal Surgery Outcome: Completed/Met Date Met:  09/03/12 Microdiscectomy     

## 2012-09-03 NOTE — Brief Op Note (Signed)
09/03/2012  9:25 AM  PATIENT:  Miguel Hamilton  56 y.o. male  PRE-OPERATIVE DIAGNOSIS:  stenosis  POST-OPERATIVE DIAGNOSIS:  stenosis  PROCEDURE:  Procedure(s) (LRB) with comments: LUMBAR LAMINECTOMY/DECOMPRESSION MICRODISCECTOMY 1 LEVEL (N/A) - lumbar three-four laminectomy decompression microdiscectomy  SURGEON:  Surgeon(s) and Role:    * Temple Pacini, MD - Primary  PHYSICIAN ASSISTANT:   ASSISTANTS: none   ANESTHESIA:   general  EBL:  Total I/O In: 1050 [I.V.:1050] Out: 250 [Blood:250]  BLOOD ADMINISTERED:none  DRAINS: (Medium) Hemovact drain(s) in the Epidural space with  Suction Open   LOCAL MEDICATIONS USED:  MARCAINE     SPECIMEN:  No Specimen  DISPOSITION OF SPECIMEN:  N/A  COUNTS:  YES  TOURNIQUET:  * No tourniquets in log *  DICTATION: .Dragon Dictation  PLAN OF CARE: Admit for overnight observation  PATIENT DISPOSITION:  PACU - hemodynamically stable.   Delay start of Pharmacological VTE agent (>24hrs) due to surgical blood loss or risk of bleeding: yes

## 2012-09-03 NOTE — Anesthesia Postprocedure Evaluation (Signed)
Anesthesia Post Note  Patient: Miguel Hamilton  Procedure(s) Performed: Procedure(s) (LRB): LUMBAR LAMINECTOMY/DECOMPRESSION MICRODISCECTOMY 1 LEVEL (N/A)  Anesthesia type: general  Patient location: PACU  Post pain: Pain level controlled  Post assessment: Patient's Cardiovascular Status Stable  Last Vitals:  Filed Vitals:   09/03/12 0930  BP:   Pulse:   Temp: 36.7 C  Resp:     Post vital signs: Reviewed and stable  Level of consciousness: sedated  Complications: No apparent anesthesia complications

## 2012-09-04 ENCOUNTER — Encounter (HOSPITAL_COMMUNITY): Payer: Self-pay | Admitting: Neurosurgery

## 2015-08-13 ENCOUNTER — Other Ambulatory Visit: Payer: Self-pay | Admitting: Rheumatology

## 2015-08-13 DIAGNOSIS — M25512 Pain in left shoulder: Secondary | ICD-10-CM

## 2015-08-13 DIAGNOSIS — M7502 Adhesive capsulitis of left shoulder: Secondary | ICD-10-CM

## 2015-08-21 ENCOUNTER — Ambulatory Visit
Admission: RE | Admit: 2015-08-21 | Discharge: 2015-08-21 | Disposition: A | Discharge status: Home / Self Care | Payer: BLUE CROSS/BLUE SHIELD | Source: Ambulatory Visit | Attending: Rheumatology | Admitting: Rheumatology

## 2015-08-21 DIAGNOSIS — M25512 Pain in left shoulder: Secondary | ICD-10-CM

## 2015-08-21 DIAGNOSIS — M7502 Adhesive capsulitis of left shoulder: Secondary | ICD-10-CM

## 2015-12-26 DIAGNOSIS — R5383 Other fatigue: Secondary | ICD-10-CM

## 2015-12-26 DIAGNOSIS — M1A9XX1 Chronic gout, unspecified, with tophus (tophi): Secondary | ICD-10-CM

## 2015-12-26 DIAGNOSIS — M8949 Other hypertrophic osteoarthropathy, multiple sites: Secondary | ICD-10-CM

## 2015-12-26 DIAGNOSIS — N181 Chronic kidney disease, stage 1: Secondary | ICD-10-CM

## 2015-12-26 DIAGNOSIS — D72829 Elevated white blood cell count, unspecified: Secondary | ICD-10-CM

## 2015-12-26 DIAGNOSIS — G629 Polyneuropathy, unspecified: Secondary | ICD-10-CM

## 2015-12-26 DIAGNOSIS — E782 Mixed hyperlipidemia: Secondary | ICD-10-CM

## 2015-12-26 DIAGNOSIS — M159 Polyosteoarthritis, unspecified: Secondary | ICD-10-CM | POA: Insufficient documentation

## 2015-12-26 DIAGNOSIS — I10 Essential (primary) hypertension: Secondary | ICD-10-CM

## 2015-12-26 DIAGNOSIS — N400 Enlarged prostate without lower urinary tract symptoms: Secondary | ICD-10-CM

## 2015-12-26 DIAGNOSIS — R5381 Other malaise: Secondary | ICD-10-CM | POA: Insufficient documentation

## 2015-12-26 DIAGNOSIS — M150 Primary generalized (osteo)arthritis: Secondary | ICD-10-CM

## 2015-12-26 HISTORY — DX: Primary generalized (osteo)arthritis: M15.0

## 2015-12-26 HISTORY — DX: Benign prostatic hyperplasia without lower urinary tract symptoms: N40.0

## 2015-12-26 HISTORY — DX: Essential (primary) hypertension: I10

## 2015-12-26 HISTORY — DX: Polyosteoarthritis, unspecified: M15.9

## 2015-12-26 HISTORY — DX: Elevated white blood cell count, unspecified: D72.829

## 2015-12-26 HISTORY — DX: Mixed hyperlipidemia: E78.2

## 2015-12-26 HISTORY — DX: Other hypertrophic osteoarthropathy, multiple sites: M89.49

## 2015-12-26 HISTORY — DX: Chronic gout, unspecified, with tophus (tophi): M1A.9XX1

## 2015-12-26 HISTORY — DX: Polyneuropathy, unspecified: G62.9

## 2015-12-26 HISTORY — DX: Chronic kidney disease, stage 1: N18.1

## 2015-12-26 HISTORY — DX: Other malaise: R53.81

## 2015-12-28 DIAGNOSIS — R7309 Other abnormal glucose: Secondary | ICD-10-CM

## 2015-12-28 DIAGNOSIS — Z79899 Other long term (current) drug therapy: Secondary | ICD-10-CM

## 2015-12-28 HISTORY — DX: Other abnormal glucose: R73.09

## 2015-12-28 HISTORY — DX: Other long term (current) drug therapy: Z79.899

## 2017-01-06 DIAGNOSIS — I10 Essential (primary) hypertension: Secondary | ICD-10-CM | POA: Diagnosis not present

## 2017-01-06 DIAGNOSIS — J9691 Respiratory failure, unspecified with hypoxia: Secondary | ICD-10-CM | POA: Diagnosis not present

## 2017-01-06 DIAGNOSIS — I2699 Other pulmonary embolism without acute cor pulmonale: Secondary | ICD-10-CM

## 2017-01-07 DIAGNOSIS — I10 Essential (primary) hypertension: Secondary | ICD-10-CM | POA: Diagnosis not present

## 2017-01-07 DIAGNOSIS — J9691 Respiratory failure, unspecified with hypoxia: Secondary | ICD-10-CM | POA: Diagnosis not present

## 2017-01-07 DIAGNOSIS — I2699 Other pulmonary embolism without acute cor pulmonale: Secondary | ICD-10-CM | POA: Diagnosis not present

## 2017-01-08 DIAGNOSIS — J9691 Respiratory failure, unspecified with hypoxia: Secondary | ICD-10-CM | POA: Diagnosis not present

## 2017-01-08 DIAGNOSIS — I2699 Other pulmonary embolism without acute cor pulmonale: Secondary | ICD-10-CM | POA: Diagnosis not present

## 2017-01-08 DIAGNOSIS — I10 Essential (primary) hypertension: Secondary | ICD-10-CM | POA: Diagnosis not present

## 2017-01-09 DIAGNOSIS — J9691 Respiratory failure, unspecified with hypoxia: Secondary | ICD-10-CM | POA: Diagnosis not present

## 2017-01-09 DIAGNOSIS — I10 Essential (primary) hypertension: Secondary | ICD-10-CM | POA: Diagnosis not present

## 2017-01-09 DIAGNOSIS — I2699 Other pulmonary embolism without acute cor pulmonale: Secondary | ICD-10-CM | POA: Diagnosis not present

## 2017-01-10 DIAGNOSIS — J9691 Respiratory failure, unspecified with hypoxia: Secondary | ICD-10-CM | POA: Diagnosis not present

## 2017-01-10 DIAGNOSIS — I2699 Other pulmonary embolism without acute cor pulmonale: Secondary | ICD-10-CM | POA: Diagnosis not present

## 2017-01-10 DIAGNOSIS — I10 Essential (primary) hypertension: Secondary | ICD-10-CM | POA: Diagnosis not present

## 2017-01-11 DIAGNOSIS — R945 Abnormal results of liver function studies: Secondary | ICD-10-CM | POA: Diagnosis not present

## 2017-01-11 DIAGNOSIS — R948 Abnormal results of function studies of other organs and systems: Secondary | ICD-10-CM

## 2017-01-11 DIAGNOSIS — I10 Essential (primary) hypertension: Secondary | ICD-10-CM | POA: Diagnosis not present

## 2017-01-12 DIAGNOSIS — R945 Abnormal results of liver function studies: Secondary | ICD-10-CM | POA: Diagnosis not present

## 2017-01-12 DIAGNOSIS — I10 Essential (primary) hypertension: Secondary | ICD-10-CM | POA: Diagnosis not present

## 2017-01-12 DIAGNOSIS — R948 Abnormal results of function studies of other organs and systems: Secondary | ICD-10-CM | POA: Diagnosis not present

## 2017-01-13 DIAGNOSIS — R9389 Abnormal findings on diagnostic imaging of other specified body structures: Secondary | ICD-10-CM | POA: Insufficient documentation

## 2017-01-13 DIAGNOSIS — K828 Other specified diseases of gallbladder: Secondary | ICD-10-CM | POA: Insufficient documentation

## 2017-01-13 DIAGNOSIS — I2510 Atherosclerotic heart disease of native coronary artery without angina pectoris: Secondary | ICD-10-CM

## 2017-01-13 DIAGNOSIS — K2970 Gastritis, unspecified, without bleeding: Secondary | ICD-10-CM

## 2017-01-13 DIAGNOSIS — K2210 Ulcer of esophagus without bleeding: Secondary | ICD-10-CM

## 2017-01-13 HISTORY — DX: Gastritis, unspecified, without bleeding: K29.70

## 2017-01-13 HISTORY — DX: Atherosclerotic heart disease of native coronary artery without angina pectoris: I25.10

## 2017-01-13 HISTORY — DX: Ulcer of esophagus without bleeding: K22.10

## 2017-01-13 HISTORY — DX: Other specified diseases of gallbladder: K82.8

## 2017-01-16 ENCOUNTER — Telehealth: Payer: Self-pay | Admitting: *Deleted

## 2017-01-16 NOTE — Telephone Encounter (Signed)
NOTES SENT TO SCHEDULING.  °

## 2017-01-17 DIAGNOSIS — Z6841 Body Mass Index (BMI) 40.0 and over, adult: Secondary | ICD-10-CM

## 2017-01-17 DIAGNOSIS — R7989 Other specified abnormal findings of blood chemistry: Secondary | ICD-10-CM

## 2017-01-17 DIAGNOSIS — R945 Abnormal results of liver function studies: Secondary | ICD-10-CM

## 2017-01-17 DIAGNOSIS — K828 Other specified diseases of gallbladder: Secondary | ICD-10-CM | POA: Insufficient documentation

## 2017-01-17 HISTORY — DX: Other specified diseases of gallbladder: K82.8

## 2017-01-17 HISTORY — DX: Other specified abnormal findings of blood chemistry: R79.89

## 2017-01-17 HISTORY — DX: Morbid (severe) obesity due to excess calories: E66.01

## 2017-01-17 HISTORY — DX: Body Mass Index (BMI) 40.0 and over, adult: Z684

## 2017-02-01 ENCOUNTER — Encounter: Payer: Self-pay | Admitting: *Deleted

## 2017-02-01 ENCOUNTER — Encounter (INDEPENDENT_AMBULATORY_CARE_PROVIDER_SITE_OTHER): Payer: Self-pay

## 2017-02-01 ENCOUNTER — Ambulatory Visit (INDEPENDENT_AMBULATORY_CARE_PROVIDER_SITE_OTHER): Payer: BLUE CROSS/BLUE SHIELD | Admitting: Physician Assistant

## 2017-02-01 ENCOUNTER — Encounter: Payer: Self-pay | Admitting: Physician Assistant

## 2017-02-01 VITALS — BP 126/78 | HR 91 | Ht 76.0 in | Wt 326.0 lb

## 2017-02-01 DIAGNOSIS — R600 Localized edema: Secondary | ICD-10-CM

## 2017-02-01 DIAGNOSIS — K2970 Gastritis, unspecified, without bleeding: Secondary | ICD-10-CM

## 2017-02-01 DIAGNOSIS — R938 Abnormal findings on diagnostic imaging of other specified body structures: Secondary | ICD-10-CM

## 2017-02-01 DIAGNOSIS — I2510 Atherosclerotic heart disease of native coronary artery without angina pectoris: Secondary | ICD-10-CM | POA: Diagnosis not present

## 2017-02-01 DIAGNOSIS — R9389 Abnormal findings on diagnostic imaging of other specified body structures: Secondary | ICD-10-CM

## 2017-02-01 DIAGNOSIS — I10 Essential (primary) hypertension: Secondary | ICD-10-CM

## 2017-02-01 MED ORDER — ASPIRIN EC 81 MG PO TBEC: 81.0000 mg | DELAYED_RELEASE_TABLET | Freq: Every day | ORAL | 3 refills | Status: AC

## 2017-02-01 NOTE — Patient Instructions (Signed)
Medication Instructions:  Your physician has recommended you make the following change in your medication: please start taking the 81 mg aspirin daily.    Labwork: Your physician recommends that you return for lab work today for BNP  Testing/Procedures: Your physician has requested that you have a lexiscan myoview. For further information please visit [email or web address] Please follow instruction sheet, as given.  Your physician has requested that you have a cardiac MRI. Cardiac MRI uses a computer to create images of your heart as its beating, producing both still and moving pictures of your heart and major blood vessels. For further information please visit [email or web address]. Please follow the instruction sheet given to you today for more information.    Follow-Up: Your physician recommends that you schedule a follow-up appointment in: 1 -2  Months with Dr. Mayford Knife  Any Other Special Instructions Will Be Listed Below (If Applicable).     If you need a refill on your cardiac medications before your next appointment, please call your pharmacy.

## 2017-02-01 NOTE — Progress Notes (Signed)
Cardiology Office Note    Date:  02/01/2017   ID:  Ramirez, Fullbright 04/01/1956, MRN [id number]  PCP:  Feliciana Rossetti, MD  Cardiologist:  New to Dr. Mayford Knife  Chief Complaint: Establish care for Coronary artery calcification on CT of chest.  History of Present Illness:   Miguel Hamilton is a 60 y.o. male with hx of HTN and recent complicated hospitalization referred by Otila Back, FNP [facility]) for coronary calcification.   Recently admitted to [facility]  3/30-4/5 for viral like syndrome, shotness of breath with hypoxia and abdominal discomfort. CTA of chest in conclusive for PE and VQ scan low probability for PE. It wad determined that he did not had PE.  Patient underwent EGD for persistent vomiting which showed gastritis (unknown biopsy result). Symptoms improved on PPI. His HIDA scan was abnormal and Gen. surgery was consulted. Felt viral-like syndrome and advised to follow-up as an outpatient. His serial cardiac enzyme was negative. Limited 2-D echo showed normal LV function difficult study.  LFTs were elevated and platelet count was low.   CTA of chest also showed low attenuation mass abutting the pericardium of the right upper heart. 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   Seen by PCP 01/13/17 and referred to pulmonary (to review CTA of chest), cardiology (for mass and calcification) and general surgery (for abnormal HIDA scan).   Seen by Dr. Georgiana Shore (General Surgery) who did not felt gallbladder is the source of his symptoms prompted for hospitalization.   Seen by Dr. Chales Abrahams (G) yesterday. LFTs back to normal/improved. No further intervention.   Previously seen by Dr. Auburn Bilberry @ [place], [place]. for routine cardiac evaluation. Per patient normal stress test.  Here today for cardiac evaluation. Patient states that he has a chronic lower extremity edema right> left due to nerve and sciatic issue. He denies any  exertional chest pain or dyspnea. Denies palpitation, dizziness, melena, orthopnea, PND, syncope. His symptoms has been resolved. He is going up and down stairs without any issue. No history of tobacco smoking or alcohol abuse.  Father had a MI at age 102 requiring CABG.    Past Medical History:  Diagnosis Date  . Abnormal computed tomography angiography (CTA) 01/13/2017  . Abnormal glucose 12/28/2015   Last Assessment & Plan:  Will update his glucose level for him and he has really been stable from this  . Arthritis    gout  . Biliary dyskinesia 01/17/2017  . BMI 40.0-44.9, adult (HCC) 01/17/2017  . 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 CKD (chronic kidney disease) stage 1, GFR 90 ml/min or greater 12/26/2015   Last Assessment & Plan:  This has been stable for him overall and will follow  . Coronary artery disease involving native coronary artery of native heart 01/13/2017  . Dysfunctional gallbladder 01/13/2017  . Elevated liver function tests 01/17/2017  . Enlarged prostate 12/26/2015   Last Assessment & Plan:  He is going to have PSA drawn to evaluate trend for him he has history of prostatitis about 2 years ago but none since  . Erosive esophagitis 01/13/2017  . Essential hypertension 12/26/2015   Last Assessment & Plan:  On repeat this was improved for him will follow  . Gastritis without bleeding 01/13/2017  . Hypertension   .  Malaise and fatigue 12/26/2015   Last Assessment & Plan:  Off and on for him and mostly tied to the fact that he is more sedentary than he needs to be  . Mixed hyperlipidemia 12/26/2015   Last Assessment & Plan:  Update his labs for him fasting, he is aware he needs to exercise to improve this  . Morbid obesity (HCC) 01/17/2017  . Peripheral neuropathy 12/26/2015    Last Assessment & Plan:  Stable for him at this time  . Primary osteoarthritis involving multiple joints 12/26/2015   Last Assessment & Plan:  Shoulder is bothering him to the right side now as he fell on this about 2 mnths ago at work and he states he is not wanting to see ortho yet for this but realizes he may be doing this soon, his other joints are fair for him  . 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 BACK SURGERY     1982  . CARPAL TUNNEL RELEASE     right   . CATARACT EXTRACTION W/ INTRAOCULAR LENS  IMPLANT, BILATERAL    . KNEE ARTHROSCOPY     left   . LUMBAR LAMINECTOMY/DECOMPRESSION MICRODISCECTOMY  09/03/2012   Procedure: LUMBAR LAMINECTOMY/DECOMPRESSION MICRODISCECTOMY 1 LEVEL;  Surgeon: Temple Pacini, MD;  Location: [facility];  Service: Neurosurgery;  Laterality: N/A;  lumbar three-four laminectomy decompression microdiscectomy  . TONSILLECTOMY    . WRIST SURGERY     left     Current Medications:  Prior to Admission medications   Medication Sig Start Date End Date Taking? Authorizing Provider  allopurinol (ZYLOPRIM) 300 MG tablet Take 300 mg by mouth daily.    Yes Historical Provider, MD  lisinopril (PRINIVIL,ZESTRIL) 40 MG tablet Take 40 mg by mouth daily.   Yes Historical Provider, MD  naproxen (NAPROSYN) 500 MG tablet Take 500 mg by mouth 2 (two) times daily with a meal.   Yes Historical Provider, MD  pantoprazole (PROTONIX) 40 MG tablet Take 40 mg by mouth daily.   Yes Historical Provider, MD  ranitidine (ZANTAC) 300 MG capsule Take 300 mg by mouth daily.   Yes Historical Provider, MD    Allergies:   Ketorolac; Oxycodone-acetaminophen; Percocet [oxycodone-acetaminophen]; Sulfa antibiotics; and Sulfacetamide sodium     Social History   Social History  . Marital status: Married    Spouse name: N/A  . Number of children: N/A  . Years of education: N/A   Social History Main Topics  . Smoking status: Never Smoker  . Smokeless tobacco: Never Used  . Alcohol use No  . Drug use: No  . Sexual activity: Not Asked   Other Topics Concern  . None   Social History Narrative  . None     Family History:  The patient's family history includes Cancer in his father; Heart attack in his father; Heart disease in his father; Hyperlipidemia in his father.   ROS:   Please see the history of present illness.    ROS All other systems reviewed and are negative.   PHYSICAL EXAM:   VS:  BP 126/78   Pulse 91   Ht  (1.93 m)   Wt (!) 326 lb (147.9 kg)  SpO2 96%   BMI 39.68 kg/m    GEN: Well nourished, well developed, in no acute distress  HEENT: normal  Neck: no JVD, carotid bruits, or masses Cardiac:  RRR; no murmurs, rubs, or gallops, 1+ BL edema R > L Respiratory:  clear to auscultation bilaterally, normal work of breathing GI: soft, nontender, nondistended, + BS MS: no deformity or atrophy  Skin: warm and dry, no rash Neuro:  Alert and Oriented x 3, Strength and sensation are intact Psych: euthymic mood, full affect  Wt Readings from Last 3 Encounters:  02/01/17 (!) 326 lb (147.9 kg)  08/28/12 (!) 334 lb 14.4 oz (151.9 kg)      Studies/Labs Reviewed:   EKG:  EKG is ordered today.  The ekg ordered today demonstrates NSR  Recent Labs: No results found for requested labs within last 8760 hours.   Lipid Panel No results found for: CHOL, TRIG, HDL, CHOLHDL, VLDL, LDLCALC, LDLDIRECT  Additional studies/ records that were reviewed today include:   As above  ASSESSMENT & PLAN:    1. Coronary artery calcification on CT of chest. - no angina or dyspnea. Will get [facility]. He can not walk due to chronic right knee/hip issue. Start ASA  qd.   2. 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   3. Gastritis with ulcer - Continue PPI. Follow up with GI.  4. HTN - Lisinopril discontinued due to hypotension by PCP. His BP is been stable without any medication.  5. Bilateral lower extremity edema  - R > L. Seems diastolic dysfunction. Outside echo with poor quality. Advised to elevate leg and try compression stockings. Check BNP today.   Plan discussed with Dr. Mayford Knife (DOD)  Medication Adjustments/Labs and Tests Ordered: Current medicines are reviewed at length with the patient today.  Concerns regarding medicines are outlined above.  Medication changes, Labs and Tests ordered today are listed in the Patient Instructions below. Patient Instructions  Medication Instructions:  Your physician has recommended you make the following change in your medication: please start taking the 81 mg aspirin daily.    Labwork: Your physician recommends that you return for lab work today for BNP  Testing/Procedures: Your physician has requested that you have a lexiscan myoview. For further information please visit [email or web address] Please follow instruction sheet, as given.  Your physician has requested that you have a cardiac MRI. Cardiac MRI uses a computer to create images of your heart as its beating, producing both still and moving pictures of your heart and major blood vessels. For further information please visit [email or web address]. Please follow the instruction sheet given to you today for more information.    Follow-Up: Your physician recommends that you schedule a follow-up appointment in: 1 -2  Months with Dr. Mayford Knife  Any Other Special Instructions Will Be Listed Below (If Applicable).     If you need a refill on your cardiac medications before your next appointment, please call your pharmacy.      Lorelei Pont, [place]  02/01/2017 3:31 PM    [facility] [address] [place], [place], [place]  [address] Phone:  [phone]; Fax: [phone]

## 2017-02-02 ENCOUNTER — Telehealth: Payer: Self-pay | Admitting: Physician Assistant

## 2017-02-02 ENCOUNTER — Telehealth: Payer: Self-pay | Admitting: Cardiology

## 2017-02-02 LAB — PRO B NATRIURETIC PEPTIDE: NT-PRO BNP: 83 pg/mL (ref 0–210)

## 2017-02-02 NOTE — Telephone Encounter (Signed)
Called and left a message for the patient to call me back with the preferred time for his cardiac MRI.

## 2017-02-02 NOTE — Telephone Encounter (Signed)
-----   Message from Sharon, [place] sent at 02/02/2017  8:21 AM EDT ----- Plan as discussed. Normal fluid marker.

## 2017-02-02 NOTE — Telephone Encounter (Signed)
Mr. Adelsberger is returning your call . Thanks

## 2017-02-03 ENCOUNTER — Ambulatory Visit: Payer: BLUE CROSS/BLUE SHIELD | Admitting: Physician Assistant

## 2017-02-08 DIAGNOSIS — R609 Edema, unspecified: Secondary | ICD-10-CM | POA: Insufficient documentation

## 2017-02-10 ENCOUNTER — Telehealth: Payer: Self-pay | Admitting: Physician Assistant

## 2017-02-10 ENCOUNTER — Encounter: Payer: Self-pay | Admitting: Physician Assistant

## 2017-02-10 NOTE — Telephone Encounter (Signed)
Pt called stating that he wanted his MRI rescheduled to 02-15-17.  He didn't realize he could have his stress test and MRI on the same day.  A staff message was sent to the technician requesting a reschedule.  I will call the patient back if it can be changed.

## 2017-02-10 NOTE — Telephone Encounter (Signed)
Called patients home and left MRI appointment information with his wife.  Letter mailed today.

## 2017-02-13 ENCOUNTER — Telehealth (HOSPITAL_COMMUNITY): Payer: Self-pay | Admitting: *Deleted

## 2017-02-13 NOTE — Telephone Encounter (Signed)
Left message on voicemail per DPR in reference to upcoming appointment scheduled on 02/15/17 at 1230 with detailed instructions given per Myocardial Perfusion Study Information Sheet for the test. LM to arrive 15 minutes early, and that it is imperative to arrive on time for appointment to keep from having the test rescheduled. If you need to cancel or reschedule your appointment, please call the office within 24 hours of your appointment. Failure to do so may result in a cancellation of your appointment, and a $50 no show fee. Phone number given for call back for any questions.

## 2017-02-15 ENCOUNTER — Other Ambulatory Visit (HOSPITAL_COMMUNITY): Payer: BLUE CROSS/BLUE SHIELD

## 2017-02-15 ENCOUNTER — Ambulatory Visit (HOSPITAL_BASED_OUTPATIENT_CLINIC_OR_DEPARTMENT_OTHER): Payer: BLUE CROSS/BLUE SHIELD

## 2017-02-15 ENCOUNTER — Ambulatory Visit (HOSPITAL_COMMUNITY)
Admission: RE | Admit: 2017-02-15 | Discharge: 2017-02-15 | Disposition: A | Discharge status: Home / Self Care | Payer: BLUE CROSS/BLUE SHIELD | Source: Ambulatory Visit | Attending: Physician Assistant | Admitting: Physician Assistant

## 2017-02-15 DIAGNOSIS — R938 Abnormal findings on diagnostic imaging of other specified body structures: Secondary | ICD-10-CM

## 2017-02-15 DIAGNOSIS — R0602 Shortness of breath: Secondary | ICD-10-CM

## 2017-02-15 LAB — POCT I-STAT CREATININE: CREATININE: 1.3 mg/dL — AB (ref 0.61–1.24)

## 2017-02-15 MED ORDER — REGADENOSON 0.4 MG/5ML IV SOLN
0.4000 mg | Freq: Once | INTRAVENOUS | Status: AC
  Administered 2017-02-15: 0.4 mg via INTRAVENOUS

## 2017-02-15 MED ORDER — TECHNETIUM TC 99M TETROFOSMIN IV KIT
33.0000 | PACK | Freq: Once | INTRAVENOUS | Status: AC | PRN
  Administered 2017-02-15: 33 via INTRAVENOUS
  Filled 2017-02-15: qty 33

## 2017-02-15 MED ORDER — GADOBENATE DIMEGLUMINE 529 MG/ML IV SOLN
40.0000 mL | Freq: Once | INTRAVENOUS | Status: AC
  Administered 2017-02-15: 40 mL via INTRAVENOUS

## 2017-02-16 ENCOUNTER — Ambulatory Visit (HOSPITAL_COMMUNITY): Payer: BLUE CROSS/BLUE SHIELD | Attending: Cardiology

## 2017-02-16 LAB — MYOCARDIAL PERFUSION IMAGING
CHL CUP NUCLEAR SDS: 2
CHL CUP NUCLEAR SSS: 5
CHL CUP RESTING HR STRESS: 91 {beats}/min
LHR: 0.36
LVDIAVOL: 106 mL (ref 62–150)
LVSYSVOL: 44 mL
Peak HR: 92 {beats}/min
SRS: 3
TID: 0.91

## 2017-02-16 MED ORDER — TECHNETIUM TC 99M TETROFOSMIN IV KIT
32.7000 | PACK | Freq: Once | INTRAVENOUS | Status: AC | PRN
  Administered 2017-02-16: 32.7 via INTRAVENOUS
  Filled 2017-02-16: qty 33

## 2017-02-22 ENCOUNTER — Other Ambulatory Visit (HOSPITAL_COMMUNITY): Payer: BLUE CROSS/BLUE SHIELD

## 2017-04-21 NOTE — Progress Notes (Signed)
Cardiology Office Note    Date:  04/24/2017   ID:  Miguel Hamilton, DOB July 07, 1956, MRN [id number]  PCP:  Miguel Hamilton., MD  Cardiologist:  Miguel Magic, MD   Chief Complaint  Patient presents with  . Follow-up    pericardial cyst, coronary artery calcifications    History of Present Illness:  Miguel Hamilton is a 61 y.o. male with hx of HTN, normal LV function on echo, low attenuation mass abutting the pericardium of the right upper heart, likely pericardiac cyst and mild to moderate coronary artery calcifications with normal stress test in the past.   He was seen by our office in April for evaluation of coronary artery calcifications.   He denied any exertional chest pain or dyspnea, palpitations, dizziness, melena, orthopnea, PND, syncope. No history of tobacco smoking or alcohol abuse.  He does have a family history of CAD with his father having a MI at age 35 requiring CABG. He underwent nuclear stress test that was low risk with no ischemia.  Cardiac MRI showed 1.5 x 2cm pericardial cyst lateral to the RA with normal LVF and mild AI, o/w normal.    Today he is doing well.  He denies any chest pain or pressure, SOB, DOE, PND, orthopnea, LE edema, dizziness, palpitations or syncope.     Past Medical History:  Diagnosis Date  . Abnormal glucose 12/28/2015   Last Assessment & Plan:  Will update his glucose level for him and he has really been stable from this  . Arthritis    gout  . Biliary dyskinesia 01/17/2017  . BMI 40.0-44.9, adult (HCC) 01/17/2017  . 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 CKD (chronic kidney disease) stage 1, GFR 90 ml/min or greater 12/26/2015   Last Assessment & Plan:  This has been stable for him overall and will follow  . Coronary artery calcification  seen on CAT scan 01/13/2017  . Dysfunctional gallbladder 01/13/2017  . Elevated liver function tests 01/17/2017  . Elevated WBC count 12/26/2015  . Enlarged prostate 12/26/2015   Last Assessment & Plan:  He is going to have PSA drawn to evaluate trend for him he has history of prostatitis about 2 years ago but none since  . Erosive esophagitis 01/13/2017  . Essential hypertension 12/26/2015   Last Assessment & Plan:  On repeat this was improved for him will follow  . Gastritis without bleeding 01/13/2017  . High risk medication use 12/28/2015  . Malaise and fatigue 12/26/2015   Last Assessment & Plan:  Off and on for him and mostly tied to the fact that he is more sedentary than he needs to be  . Mixed hyperlipidemia 12/26/2015   Last Assessment & Plan:  Update his labs for him fasting, he is aware he needs to exercise to improve this  . Morbid obesity (HCC) 01/17/2017  . 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 Peripheral neuropathy 12/26/2015   Last Assessment & Plan:  Stable for him at this time  . Primary osteoarthritis involving multiple joints 12/26/2015   Last Assessment & Plan:  Shoulder is bothering him to the right side now as he fell on this about 2 mnths ago at work and he states he is not wanting to see ortho yet for this but realizes he may be  doing this soon, his other joints are fair for him  . 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 BACK SURGERY     1982  . CARPAL TUNNEL RELEASE     right   . CATARACT EXTRACTION W/ INTRAOCULAR LENS  IMPLANT, BILATERAL    . KNEE ARTHROSCOPY     left   . LUMBAR LAMINECTOMY/DECOMPRESSION MICRODISCECTOMY  09/03/2012   Procedure: LUMBAR LAMINECTOMY/DECOMPRESSION MICRODISCECTOMY 1 LEVEL;  Surgeon: Temple Pacini,  MD;  Location: [facility];  Service: Neurosurgery;  Laterality: N/A;  lumbar three-four laminectomy decompression microdiscectomy  . TONSILLECTOMY    . WRIST SURGERY     left     Current Medications: Current Meds  Medication Sig  . allopurinol (ZYLOPRIM) 300 MG tablet Take 300 mg by mouth daily.   Marland Kitchen aspirin EC 81 MG tablet Take 1 tablet (81 mg total) by mouth daily.  . naproxen (NAPROSYN) 500 MG tablet Take 500 mg by mouth 2 (two) times daily as needed (pain).   . pantoprazole (PROTONIX) 40 MG tablet Take 40 mg by mouth daily.  . ranitidine (ZANTAC) 300 MG tablet Take 300 mg by mouth daily.    Allergies:   Ketorolac; Oxycodone-acetaminophen; Percocet [oxycodone-acetaminophen]; Sulfa antibiotics; and Sulfacetamide sodium   Social History   Social History  . Marital status: Married    Spouse name: N/A  . Number of children: N/A  . Years of education: N/A   Social History Main Topics  . Smoking status: Never Smoker  . Smokeless tobacco: Current User    Types: Chew  . Alcohol use No  . Drug use: No  . Sexual activity: Not Asked   Other Topics Concern  . None   Social History Narrative  . None     Family History:  The patient's family history includes Cancer in his father; Heart attack in his father; Heart disease in his father; Hyperlipidemia in his father.   ROS:   Please see the history of present illness.    ROS All other systems reviewed and are negative.  No flowsheet data found.     PHYSICAL EXAM:   VS:  BP 124/82   Pulse 88   Ht 6\' 4"  (1.93 m)   Wt 284 lb (128.8 kg)   SpO2 97%   BMI 34.57 kg/m    GEN: Well nourished, well developed, in no acute distress  HEENT: normal  Neck: no JVD, carotid bruits, or masses Cardiac: RRR; no murmurs, rubs, or gallops,no edema.  Intact distal pulses bilaterally.  Respiratory:  clear to auscultation bilaterally, normal work of breathing GI: soft, nontender, nondistended, + BS MS: no deformity or atrophy  Skin:  warm and dry, no rash Neuro:  Alert and Oriented x 3, Strength and sensation are intact Psych: euthymic mood, full affect  Wt Readings from Last 3 Encounters:  04/24/17 284 lb (128.8 kg)  02/01/17 (!) 326 lb (147.9 kg)  08/28/12 (!) 334 lb 14.4 oz (151.9 kg)      Studies/Labs Reviewed:   EKG:  EKG is not ordered today.    Recent Labs: 02/01/2017: NT-Pro BNP 83 02/15/2017: Creatinine, Ser 1.30   Lipid Panel No results found for: CHOL, TRIG, HDL, CHOLHDL,  VLDL, LDLCALC, LDLDIRECT  Additional studies/ records that were reviewed today include:  Cardiac MRI and nuclear stress test    ASSESSMENT:    1. Coronary artery calcification seen on CAT scan   2. Essential hypertension   3. Pericardial cyst      PLAN:  In order of problems listed above:  1. Coronary artery calcifications by chest CT - nuclear stress test was low risk with no ischemia.  He needs aggressive risk factor modification.  BP controlled.  He does not smoke.  He will continue on ASA.  I will check an FLP to see if he needs to be on a statin.   2. HTN - BP controlled on exam today on diet alone.   3. Pericardial cyst - asymptomatic.  Will repeat cardiac MRI 02/2018 to make sure size remains stable.  No indication for surgical eva at this time.   4.  Hyperlipidemia - he has a history of this but is not on statin.  Check FLP    Medication Adjustments/Labs and Tests Ordered: Current medicines are reviewed at length with the patient today.  Concerns regarding medicines are outlined above.  Medication changes, Labs and Tests ordered today are listed in the Patient Instructions below.  There are no Patient Instructions on file for this visit.   Signed, Miguel Magicraci Turner, MD  04/24/2017 8:13 AM    [facility] [address], [place], [place]  [address] Phone: [phone]; Fax: [phone]

## 2017-04-24 ENCOUNTER — Ambulatory Visit (INDEPENDENT_AMBULATORY_CARE_PROVIDER_SITE_OTHER): Payer: BLUE CROSS/BLUE SHIELD | Admitting: Cardiology

## 2017-04-24 ENCOUNTER — Encounter: Payer: Self-pay | Admitting: Cardiology

## 2017-04-24 ENCOUNTER — Encounter (INDEPENDENT_AMBULATORY_CARE_PROVIDER_SITE_OTHER): Payer: Self-pay

## 2017-04-24 VITALS — BP 124/82 | HR 88 | Ht 76.0 in | Wt 284.0 lb

## 2017-04-24 DIAGNOSIS — I2510 Atherosclerotic heart disease of native coronary artery without angina pectoris: Secondary | ICD-10-CM | POA: Diagnosis not present

## 2017-04-24 DIAGNOSIS — Q248 Other specified congenital malformations of heart: Secondary | ICD-10-CM

## 2017-04-24 DIAGNOSIS — I10 Essential (primary) hypertension: Secondary | ICD-10-CM

## 2017-04-24 HISTORY — DX: Other specified congenital malformations of heart: Q24.8

## 2017-04-24 NOTE — Patient Instructions (Signed)
Medication Instructions:  Your physician recommends that you continue on your current medications as directed. Please refer to the Current Medication list given to you today.   Labwork: TODAY: FLP, Liver  Testing/Procedures: Dr. Mayford Knifeurner recommends you have a CARDIAC MRI next May.  Follow-Up: Your physician wants you to follow-up in: 1 year with Dr. Mayford Knifeurner. You will receive a reminder letter in the mail two months in advance. If you don't receive a letter, please call our office to schedule the follow-up appointment.   Any Other Special Instructions Will Be Listed Below (If Applicable).     If you need a refill on your cardiac medications before your next appointment, please call your pharmacy.

## 2017-04-25 LAB — HEPATIC FUNCTION PANEL
ALBUMIN: 4.8 g/dL (ref 3.5–5.5)
ALT: 26 IU/L (ref 0–44)
AST: 33 IU/L (ref 0–40)
Alkaline Phosphatase: 84 IU/L (ref 39–117)
BILIRUBIN TOTAL: 0.7 mg/dL (ref 0.0–1.2)
Bilirubin, Direct: 0.21 mg/dL (ref 0.00–0.40)
Total Protein: 6.6 g/dL (ref 6.0–8.5)

## 2017-04-25 LAB — LIPID PANEL
CHOL/HDL RATIO: 5.7 ratio — AB (ref 0.0–5.0)
Cholesterol, Total: 211 mg/dL — ABNORMAL HIGH (ref 100–199)
HDL: 37 mg/dL — AB (ref >39)
LDL Calculated: 160 mg/dL — ABNORMAL HIGH (ref 0–99)
Triglycerides: 68 mg/dL (ref 0–149)
VLDL CHOLESTEROL CAL: 14 mg/dL (ref 5–40)

## 2017-04-27 ENCOUNTER — Telehealth: Payer: Self-pay

## 2017-04-27 DIAGNOSIS — E782 Mixed hyperlipidemia: Secondary | ICD-10-CM

## 2017-04-27 NOTE — Telephone Encounter (Signed)
Informed patient of results and verbal understanding expressed.  Patient adamantly refuses to start any cholesterol lowering medications. He states he will follow a low fat diet. Recall placed to repeat fasting labs in 4 months. Patient agrees with treatment plan.

## 2017-04-27 NOTE — Telephone Encounter (Signed)
-----   Message from Quintella Reichertraci R Turner, MD sent at 04/25/2017  5:42 PM EDT ----- LDL too high - goal < 70.  Start Lipitor 40mg  daily and repeat FLP ad ALT in 6 weeks

## 2017-06-15 DIAGNOSIS — R760 Raised antibody titer: Secondary | ICD-10-CM | POA: Insufficient documentation

## 2018-01-22 ENCOUNTER — Telehealth: Payer: Self-pay | Admitting: Cardiology

## 2018-01-22 NOTE — Telephone Encounter (Signed)
Called patient to find out preferred time for cardiac MRI in May 2019, but, he wants to talk to his wife and then call me back.

## 2018-01-29 ENCOUNTER — Telehealth: Payer: Self-pay | Admitting: Cardiology

## 2018-01-29 NOTE — Telephone Encounter (Signed)
Called patient a second time and he has still not talked to his wife about the cardiac MRI and does not sound like he will be scheduling it any time soon.

## 2018-02-27 DIAGNOSIS — T7589XA Other specified effects of external causes, initial encounter: Secondary | ICD-10-CM

## 2018-02-27 DIAGNOSIS — Z779 Other contact with and (suspected) exposures hazardous to health: Secondary | ICD-10-CM | POA: Insufficient documentation

## 2018-02-27 DIAGNOSIS — R062 Wheezing: Secondary | ICD-10-CM | POA: Insufficient documentation

## 2018-08-22 ENCOUNTER — Ambulatory Visit: Payer: BLUE CROSS/BLUE SHIELD | Admitting: Cardiology

## 2018-09-04 ENCOUNTER — Ambulatory Visit: Payer: BLUE CROSS/BLUE SHIELD | Admitting: Cardiology

## 2018-09-04 ENCOUNTER — Encounter: Payer: Self-pay | Admitting: Cardiology

## 2018-09-04 VITALS — BP 162/82 | HR 88 | Ht 76.0 in | Wt 379.0 lb

## 2018-09-04 DIAGNOSIS — Q248 Other specified congenital malformations of heart: Secondary | ICD-10-CM

## 2018-09-04 DIAGNOSIS — N181 Chronic kidney disease, stage 1: Secondary | ICD-10-CM

## 2018-09-04 DIAGNOSIS — I10 Essential (primary) hypertension: Secondary | ICD-10-CM | POA: Diagnosis not present

## 2018-09-04 DIAGNOSIS — I2510 Atherosclerotic heart disease of native coronary artery without angina pectoris: Secondary | ICD-10-CM

## 2018-09-04 DIAGNOSIS — E782 Mixed hyperlipidemia: Secondary | ICD-10-CM | POA: Diagnosis not present

## 2018-09-04 MED ORDER — LISINOPRIL 20 MG PO TABS: 20.0000 mg | ORAL_TABLET | Freq: Every day | ORAL | 1 refills | Status: AC

## 2018-09-04 NOTE — Patient Instructions (Signed)
Medication Instructions:  Your physician has recommended you make the following change in your medication:   INCREASE lisinopril 20 mg daily  If you need a refill on your cardiac medications before your next appointment, please call your pharmacy.   Lab work: None  If you have labs (blood work) drawn today and your tests are completely normal, you will receive your results only by: Marland Kitchen. MyChart Message (if you have MyChart) OR . A paper copy in the mail If you have any lab test that is abnormal or we need to change your treatment, we will call you to review the results.  Testing/Procedures: None  Follow-Up: At [facility], you and your health needs are our priority.  As part of our continuing mission to provide you with exceptional heart care, we have created designated Provider Care Teams.  These Care Teams include your primary Cardiologist (physician) and Advanced Practice Providers (APPs -  Physician Assistants and Nurse Practitioners) who all work together to provide you with the care you need, when you need it.  You will need a follow up appointment in 4 months.  Please call our office 2 months in advance to schedule this appointment.  You may see another member of our [facility] Provider Team in [place]: Gypsy Balsamobert Krasowski, MD . Norman HerrlichBrian Munley, MD  Any Other Special Instructions Will Be Listed Below (If Applicable).

## 2018-09-04 NOTE — Progress Notes (Signed)
Cardiology Office Note:    Date:  09/04/2018   ID:  Miguel Hamilton, DOB 1956/06/17, MRN [id number]  PCP:  Gordan Payment., MD  Cardiologist:  Garwin Brothers, MD   Referring MD: Gordan Payment., MD    ASSESSMENT:    1. Pericardial cyst   2. Mixed hyperlipidemia   3. Essential hypertension   4. Coronary artery calcification seen on CAT scan   5. Morbid obesity (HCC)   6. CKD (chronic kidney disease) stage 1, GFR 90 ml/min or greater    PLAN:    In order of problems listed above:  1. Secondary prevention stressed with the patient.  Importance of compliance with diet and medication stressed and he vocalized understanding.  His blood pressure is stable.  Diet was discussed for dyslipidemia and obesity.  Risks of obesity explained he vocalized understanding. 2. His blood pressure does have significant elevations have doubled his lisinopril to 20 mg daily.  He tells me that he will go to his primary care doctor's office for his annual physical blood work in a week or so and have asked him to get pulse blood pressure check also and Chem-7 amongst other blood work at that office and send a copy to [place].  We will try to treat his medications accordingly. 3. He is a patient with coronary calcification must be on statin therapy.  Since I do not want his blood work out like his primary care physician to follow-up with his blood work and initiated him on statin therapy if appropriate. 4. Patient will be seen in follow-up appointment in 6 months or earlier if the patient has any concerns    Medication Adjustments/Labs and Tests Ordered: Current medicines are reviewed at length with the patient today.  Concerns regarding medicines are outlined above.  Orders Placed This Encounter  Procedures  . EKG 12-Lead   Meds ordered this encounter  Medications  . lisinopril (PRINIVIL,ZESTRIL) 20 MG tablet    Sig: Take 1 tablet (20 mg total) by mouth daily.    Dispense:  90 tablet    Refill:  1      No chief complaint on file.    History of Present Illness:    Miguel Hamilton is a 62 y.o. male.  Patient has past medical history of essential hypertension, dyslipidemia, coronary artery calcification and morbid obesity.  He tells me that he has gained about 80 pounds this year he leads a very hectic lifestyle in terms of work in the hours he spends at work.  He eats outside and has been less than compliant with dietary instructions.  I asked him on multiple occasions where they had any symptoms of chest pain and shortness of breath and he answers in the negative.  He tells me that he wants his blood pressure to be managed and optimized which is the only reason for which she is here.  At the time of my evaluation, the patient is alert awake oriented and in no distress.  Past Medical History:  Diagnosis Date  . Abnormal glucose 12/28/2015   Last Assessment & Plan:  Will update his glucose level for him and he has really been stable from this  . Arthritis    gout  . Biliary dyskinesia 01/17/2017  . BMI 40.0-44.9, adult (HCC) 01/17/2017  . 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 CKD (chronic kidney disease) stage 1, GFR 90 ml/min or greater 12/26/2015   Last Assessment & Plan:  This has been stable for him overall and will follow  . Coronary artery calcification seen on CAT scan 01/13/2017  . Dysfunctional gallbladder 01/13/2017  . Elevated liver function tests 01/17/2017  . Elevated WBC count 12/26/2015  . Enlarged prostate 12/26/2015   Last Assessment & Plan:  He is going to have PSA drawn to evaluate trend for him he has history of prostatitis about 2 years ago but none since  . Erosive esophagitis 01/13/2017  . Essential hypertension 12/26/2015   Last Assessment & Plan:  On repeat this was  improved for him will follow  . Gastritis without bleeding 01/13/2017  . High risk medication use 12/28/2015  . Malaise and fatigue 12/26/2015   Last Assessment & Plan:  Off and on for him and mostly tied to the fact that he is more sedentary than he needs to be  . Mixed hyperlipidemia 12/26/2015   Last Assessment & Plan:  Update his labs for him fasting, he is aware he needs to exercise to improve this  . Morbid obesity (HCC) 01/17/2017  . 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 Peripheral neuropathy 12/26/2015   Last Assessment & Plan:  Stable for him at this time  . Primary osteoarthritis involving multiple joints 12/26/2015   Last Assessment & Plan:  Shoulder is bothering him to the right side now as he fell on this about 2 mnths ago at work and he states he is not wanting to see ortho yet for this but realizes he may be doing this soon, his other joints are fair for him  . 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 BACK SURGERY     1982  . CARPAL TUNNEL RELEASE     right   . CATARACT EXTRACTION W/ INTRAOCULAR LENS  IMPLANT, BILATERAL    . KNEE ARTHROSCOPY     left   . LUMBAR LAMINECTOMY/DECOMPRESSION MICRODISCECTOMY  09/03/2012   Procedure: LUMBAR LAMINECTOMY/DECOMPRESSION MICRODISCECTOMY 1 LEVEL;  Surgeon: Temple PaciniHenry A Pool, MD;  Location: [facility];  Service: Neurosurgery;  Laterality: N/A;  lumbar three-four laminectomy decompression microdiscectomy  . TONSILLECTOMY    . WRIST SURGERY     left     Current Medications: Current Meds  Medication Sig  . allopurinol (ZYLOPRIM) 300 MG tablet Take 300 mg by mouth daily.   . furosemide (LASIX) 20 MG tablet Take 1 tablet by mouth daily.  . nitroGLYCERIN (NITROSTAT) 0.4 MG SL tablet Place 1 tablet under  the tongue every 5 (five) minutes as needed.  . [DISCONTINUED] lisinopril (PRINIVIL,ZESTRIL) 10 MG tablet Take 1 tablet by mouth daily.     Allergies:   Ketorolac; Oxycodone-acetaminophen; Percocet [oxycodone-acetaminophen]; Sulfa antibiotics; and Sulfacetamide sodium   Social History   Socioeconomic History  . Marital status: Married    Spouse name: Not on file  . Number of children: Not on file  . Years of education: Not on file  . Highest education level: Not on file  Occupational History  . Not on file  Social Needs  . Financial resource strain: Not  on file  . Food insecurity:    Worry: Not on file    Inability: Not on file  . Transportation needs:    Medical: Not on file    Non-medical: Not on file  Tobacco Use  . Smoking status: Never Smoker  . Smokeless tobacco: Current User    Types: Chew  Substance and Sexual Activity  . Alcohol use: No  . Drug use: No  . Sexual activity: Not on file  Lifestyle  . Physical activity:    Days per week: Not on file    Minutes per session: Not on file  . Stress: Not on file  Relationships  . Social connections:    Talks on phone: Not on file    Gets together: Not on file    Attends religious service: Not on file    Active member of club or organization: Not on file    Attends meetings of clubs or organizations: Not on file    Relationship status: Not on file  Other Topics Concern  . Not on file  Social History Narrative  . Not on file     Family History: The patient's family history includes Cancer in his father; Heart attack in his father; Heart disease in his father; Hyperlipidemia in his father.  ROS:   Please see the history of present illness.    All other systems reviewed and are negative.  EKGs/Labs/Other Studies Reviewed:    The following studies were reviewed today: EKG reveals sinus rhythm and nonspecific ST-T changes.   Recent Labs: No results found for requested labs within last 8760 hours.  Recent  Lipid Panel    Component Value Date/Time   CHOL 211 (H) 04/24/2017 0824   TRIG 68 04/24/2017 0824   HDL 37 (L) 04/24/2017 0824   CHOLHDL 5.7 (H) 04/24/2017 0824   LDLCALC 160 (H) 04/24/2017 9604    Physical Exam:    VS:  BP (!) 162/82 (BP Location: Right Arm, Patient Position: Sitting, Cuff Size: Normal)   Pulse 88   Ht 6\' 4"  (1.93 m)   Wt (!) 379 lb (171.9 kg)   SpO2 98%   BMI 46.13 kg/m     Wt Readings from Last 3 Encounters:  09/04/18 (!) 379 lb (171.9 kg)  04/24/17 284 lb (128.8 kg)  02/01/17 (!) 326 lb (147.9 kg)     GEN: Patient is in no acute distress HEENT: Normal NECK: No JVD; No carotid bruits LYMPHATICS: No lymphadenopathy CARDIAC: Hear sounds regular, 2/6 systolic murmur at the apex. RESPIRATORY:  Clear to auscultation without rales, wheezing or rhonchi  ABDOMEN: Soft, non-tender, non-distended MUSCULOSKELETAL:  No edema; No deformity  SKIN: Warm and dry NEUROLOGIC:  Alert and oriented x 3 PSYCHIATRIC:  Normal affect   Signed, Garwin Brothers, MD  09/04/2018 4:35 PM    [facility]

## 2019-09-30 ENCOUNTER — Encounter: Payer: Self-pay | Admitting: Gastroenterology

## 2019-11-15 DIAGNOSIS — I10 Essential (primary) hypertension: Secondary | ICD-10-CM

## 2019-11-15 DIAGNOSIS — U071 COVID-19: Secondary | ICD-10-CM

## 2020-02-20 NOTE — Progress Notes (Signed)
Virtual Visit via Telephone Note   This visit type was conducted due to national recommendations for restrictions regarding the COVID-19 Pandemic (e.g. social distancing) in an effort to limit this patient's exposure and mitigate transmission in our community.  Due to his co-morbid illnesses, this patient is at least at moderate risk for complications without adequate follow up.  This format is felt to be most appropriate for this patient at this time.  All issues noted in this document were discussed and addressed.  A limited physical exam was performed with this format.  Please refer to the patient's chart for his consent to telehealth for [facility].   Evaluation Performed:  Follow-up visit  This visit type was conducted due to national recommendations for restrictions regarding the COVID-19 Pandemic (e.g. social distancing).  This format is felt to be most appropriate for this patient at this time.  All issues noted in this document were discussed and addressed.  No physical exam was performed (except for noted visual exam findings with Video Visits).  Please refer to the patient's chart (MyChart message for video visits and phone note for telephone visits) for the patient's consent to telehealth for [facility].  Date:  02/21/2020   ID:  Miguel Hamilton, DOB 1956-03-28, MRN [id number]  Patient Location:  Home  Provider location:   [place]  PCP:  Gordan Payment., MD  Cardiologist: Armanda Magic, MD Electrophysiologist:  None   Chief Complaint:  Pericardial cyst, coronary artery calcifications  History of Present Illness:    Miguel Hamilton is a 64 y.o. male who presents via audio/video conferencing for a telehealth visit today.    Miguel Hamilton is a 64 y.o. male with hx of HTN, normal LV function on echo, low attenuation mass abutting the pericardium of the right upper heart, likely pericardiac cyst. Cardiac MRI showed 1.5 x 2cm pericardial cyst lateral to the RA with normal LVF  and mild AI, o/w normal.  He also has a family history of CAD with his father having a MI at age 48 requiring CABG. He underwent nuclear stress test that was low risk with no ischemia.  Unfortunately he was very sick with COVID this winter and was hospitalized and recently got off O2.  He missed 2 months of work and then had a detached retina and missed more work.   He is here today for followup and is doing well.  He feels he has recovered from COVID 19.  He denies any chest pain or pressure, SOB, DOE, PND, orthopnea, LE edema, dizziness, palpitations or syncope. He is compliant with his meds and is tolerating meds with no SE.    The patient does not have symptoms concerning for COVID-19 infection (fever, chills, cough, or new shortness of breath).   Prior CV studies:   The following studies were reviewed today:  None  Past Medical History:  Diagnosis Date  . Abnormal glucose 12/28/2015   Last Assessment & Plan:  Will update his glucose level for him and he has really been stable from this  . Arthritis    gout  . Biliary dyskinesia 01/17/2017  . BMI 40.0-44.9, adult (HCC) 01/17/2017  . 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 CKD (chronic kidney disease) stage 1, GFR 90 ml/min or greater 12/26/2015  Last Assessment & Plan:  This has been stable for him overall and will follow  . Coronary artery calcification seen on CAT scan 01/13/2017  . Dysfunctional gallbladder 01/13/2017  . Elevated liver function tests 01/17/2017  . Elevated WBC count 12/26/2015  . Enlarged prostate 12/26/2015   Last Assessment & Plan:  He is going to have PSA drawn to evaluate trend for him he has history of prostatitis about 2 years ago but none since  . Erosive esophagitis 01/13/2017  . Essential hypertension  12/26/2015   Last Assessment & Plan:  On repeat this was improved for him will follow  . Gastritis without bleeding 01/13/2017  . High risk medication use 12/28/2015  . Malaise and fatigue 12/26/2015   Last Assessment & Plan:  Off and on for him and mostly tied to the fact that he is more sedentary than he needs to be  . Mixed hyperlipidemia 12/26/2015   Last Assessment & Plan:  Update his labs for him fasting, he is aware he needs to exercise to improve this  . Morbid obesity (HCC) 01/17/2017  . 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 Peripheral neuropathy 12/26/2015   Last Assessment & Plan:  Stable for him at this time  . Primary osteoarthritis involving multiple joints 12/26/2015   Last Assessment & Plan:  Shoulder is bothering him to the right side now as he fell on this about 2 mnths ago at work and he states he is not wanting to see ortho yet for this but realizes he may be doing this soon, his other joints are fair for him  . 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 BACK SURGERY     1982  . CARPAL TUNNEL RELEASE     right   . CATARACT EXTRACTION W/ INTRAOCULAR LENS  IMPLANT, BILATERAL    . KNEE ARTHROSCOPY     left   . LUMBAR LAMINECTOMY/DECOMPRESSION MICRODISCECTOMY  09/03/2012   Procedure: LUMBAR LAMINECTOMY/DECOMPRESSION MICRODISCECTOMY 1 LEVEL;  Surgeon: Temple Pacini, MD;  Location: [facility];  Service: Neurosurgery;  Laterality: N/A;  lumbar three-four laminectomy decompression microdiscectomy  . TONSILLECTOMY    . WRIST SURGERY     left      Current Meds  Medication Sig  . albuterol (VENTOLIN HFA) 108 (90 Base) MCG/ACT inhaler Inhale 1 puff into the lungs as needed.  Marland Kitchen allopurinol (ZYLOPRIM) 300 MG tablet Take 300 mg by mouth daily.     Marland Kitchen aspirin EC 81 MG tablet Take 1 tablet (81 mg total) by mouth daily.  . dorzolamide-timolol (COSOPT) 22.3-6.8 MG/ML ophthalmic solution Place 1 drop into the right eye 2 (two) times daily.  Marland Kitchen lisinopril (ZESTRIL) 10 MG tablet Take 10 mg by mouth 2 (two) times daily.  . nitroGLYCERIN (NITROSTAT) 0.4 MG SL tablet Place 1 tablet under the tongue every 5 (five) minutes as needed.  . rosuvastatin (CRESTOR) 5 MG tablet Take 5 mg by mouth daily.     Allergies:   Ketorolac, Oxycodone-acetaminophen, Percocet [oxycodone-acetaminophen], Sulfa antibiotics, and Sulfacetamide sodium   Social History   Tobacco Use  . Smoking status: Never Smoker  . Smokeless tobacco: Current User    Types: Chew  Substance Use Topics  . Alcohol use:  No  . Drug use: No     Family Hx: The patient's family history includes Cancer in his father; Heart attack in his father; Heart disease in his father; Hyperlipidemia in his father.  ROS:   Please see the history of present illness.     All other systems reviewed and are negative.   Labs/Other Tests and Data Reviewed:    Recent Labs: No results found for requested labs within last 8760 hours.   Recent Lipid Panel Lab Results  Component Value Date/Time   CHOL 211 (H) 04/24/2017 08:24 AM   TRIG 68 04/24/2017 08:24 AM   HDL 37 (L) 04/24/2017 08:24 AM   CHOLHDL 5.7 (H) 04/24/2017 08:24 AM   LDLCALC 160 (H) 04/24/2017 08:24 AM    Wt Readings from Last 3 Encounters:  02/21/20 (!) 350 lb (158.8 kg)  09/04/18 (!) 379 lb (171.9 kg)  04/24/17 284 lb (128.8 kg)     Objective:    Vital Signs:  BP 137/74   Pulse 79   Wt (!) 350 lb (158.8 kg)   BMI 42.60 kg/m    ASSESSMENT & PLAN:    1.  Coronary artery calcifications by chest CT  -nuclear stress test was low risk with no ischemia.   -he denies any anginal sx -continue ASA and statin    2.  HTN  -Continue Lisinopril 20mg  daily  3.  Pericardial cyst  -asymptomatic.   -he was supposed to get a  repeat cardiac MRI 02/2018 to make sure size remains stable but this never occurred -will repeat cardiac MRI  4.  Hyperlipidemia  -LDL goal is < 70 -he is now on Crestor 5mg  daily -I will get a copy of his last FLP and ALT from his PCP  Patient Risk:   After full review of this patient's clinical status, I feel that they are at least moderate risk at this time.  Time:   Today, I have spent 20 minutes on Telemedicine discussing medical problems including Coronary artery Ca++, HTN, pericardial cyst and HLD and reviewing patient's chart including outside labs from PCP, Cardiac MRI 2018.  Medication Adjustments/Labs and Tests Ordered: Current medicines are reviewed at length with the patient today.  Concerns regarding medicines are outlined above.  Tests Ordered: No orders of the defined types were placed in this encounter.  Medication Changes: No orders of the defined types were placed in this encounter.   Disposition:  Follow up in 1 year(s)  Signed, Fransico Him, MD  02/21/2020 9:26 AM    [facility]

## 2020-02-21 ENCOUNTER — Other Ambulatory Visit: Payer: Self-pay

## 2020-02-21 ENCOUNTER — Encounter: Payer: Self-pay | Admitting: Cardiology

## 2020-02-21 ENCOUNTER — Telehealth (INDEPENDENT_AMBULATORY_CARE_PROVIDER_SITE_OTHER): Payer: BC Managed Care – PPO | Admitting: Cardiology

## 2020-02-21 VITALS — BP 137/74 | HR 79 | Wt 350.0 lb

## 2020-02-21 DIAGNOSIS — Q248 Other specified congenital malformations of heart: Secondary | ICD-10-CM | POA: Diagnosis not present

## 2020-02-21 DIAGNOSIS — E782 Mixed hyperlipidemia: Secondary | ICD-10-CM | POA: Diagnosis not present

## 2020-02-21 DIAGNOSIS — I2510 Atherosclerotic heart disease of native coronary artery without angina pectoris: Secondary | ICD-10-CM

## 2020-02-21 DIAGNOSIS — I10 Essential (primary) hypertension: Secondary | ICD-10-CM

## 2020-02-21 NOTE — Patient Instructions (Signed)
Medication Instructions:  No changes *If you need a refill on your cardiac medications before your next appointment, please call your pharmacy*   Lab Work: None If you have labs (blood work) drawn today and your tests are completely normal, you will receive your results only by: Marland Kitchen MyChart Message (if you have MyChart) OR . A paper copy in the mail If you have any lab test that is abnormal or we need to change your treatment, we will call you to review the results.   Testing/Procedures: Your physician has requested that you have a cardiac MRI. Cardiac MRI uses a computer to create images of your heart as its beating, producing both still and moving pictures of your heart and major blood vessels. For further information please visit [email or web address]. Please follow the instruction sheet given to you today for more information.   Follow-Up: At [facility], you and your health needs are our priority.  As part of our continuing mission to provide you with exceptional heart care, we have created designated Provider Care Teams.  These Care Teams include your primary Cardiologist (physician) and Advanced Practice Providers (APPs -  Physician Assistants and Nurse Practitioners) who all work together to provide you with the care you need, when you need it.  We recommend signing up for the patient portal called "MyChart".  Sign up information is provided on this After Visit Summary.  MyChart is used to connect with patients for Virtual Visits (Telemedicine).  Patients are able to view lab/test results, encounter notes, upcoming appointments, etc.  Non-urgent messages can be sent to your provider as well.   To learn more about what you can do with MyChart, go to [email or web address].    Your next appointment:   12 month(s)  The format for your next appointment:   Either In Person or Virtual  Provider:   Armanda Magic, MD   Other Instructions

## 2020-02-21 NOTE — Addendum Note (Signed)
Addended by: Lendon Ka on: 02/21/2020 09:43 AM   Modules accepted: Orders

## 2020-03-27 ENCOUNTER — Telehealth: Payer: Self-pay | Admitting: *Deleted

## 2020-03-27 NOTE — Telephone Encounter (Signed)
Spoke with patient regarding the Cardiac MRI ordered by Dr. Garlon Hatchet he will call me back next week to discuss scheduling.

## 2020-04-07 NOTE — Telephone Encounter (Signed)
Left message for patient to call and discuss scheduling Cardiac MRI ordered by Dr. Turner 

## 2020-04-10 NOTE — Telephone Encounter (Signed)
Spoke with patient regarding preferred appointment dates and time for Cardiac MRI ordered by Dr. Marcha Solders prefers August---I will notify the patient of his scheduled appointment by noon Tuesday 04/14/20.

## 2020-04-15 NOTE — Telephone Encounter (Signed)
Left message for patient to call regarding appointment for Cardiac MRI ordered by Dr. Mayford Knife

## 2020-04-20 ENCOUNTER — Encounter: Payer: Self-pay | Admitting: Cardiology

## 2020-04-20 NOTE — Telephone Encounter (Signed)
Spoke with patient regarding appointment for Cardiac MRI scheduled Monday 05/25/20 at 8:00 am at [facility]-----arrival time is 7:30 am 1st floor admissions office.  Will mail information to patient and he voiced his understanding.

## 2020-05-22 ENCOUNTER — Telehealth (HOSPITAL_COMMUNITY): Payer: Self-pay | Admitting: *Deleted

## 2020-05-22 NOTE — Telephone Encounter (Signed)
Reaching out to patient to offer assistance regarding upcoming cardiac imaging study; pt verbalizes understanding of appt date/time, parking situation and where to check in, and verified current allergies; name and call back number provided for further questions should they arise  [facility] [phone] office [phone] cell

## 2020-05-25 ENCOUNTER — Other Ambulatory Visit: Payer: Self-pay

## 2020-05-25 ENCOUNTER — Ambulatory Visit (HOSPITAL_COMMUNITY)
Admission: RE | Admit: 2020-05-25 | Discharge: 2020-05-25 | Disposition: A | Discharge status: Home / Self Care | Payer: BC Managed Care – PPO | Source: Ambulatory Visit | Attending: Cardiology | Admitting: Cardiology

## 2020-05-25 DIAGNOSIS — Q248 Other specified congenital malformations of heart: Secondary | ICD-10-CM | POA: Insufficient documentation

## 2020-05-25 MED ORDER — GADOBUTROL 1 MMOL/ML IV SOLN
10.0000 mL | Freq: Once | INTRAVENOUS | Status: AC | PRN
  Administered 2020-05-25: 10 mL via INTRAVENOUS

## 2021-12-28 IMAGING — MR MR CARD MORPHOLOGY WO/W CM
43 of 48 series · 43 of 48 positions shown · IV contrast (Contrast agent)
Comparison: none

CLINICAL DATA: Evaluate suspected pericardial cyst

EXAM:
CARDIAC MRI
TECHNIQUE: The patient was scanned on a 1.5 Tesla Siemens magnet. A dedicated
cardiac coil was used. Functional imaging was done using Fiesta
sequences. [DATE], and 4 chamber views were done to assess for RWMA's.
Modified Zulaikha rule using a short axis stack was used to
calculate an ejection fraction on a dedicated work station using
Circle software. The patient received 10 cc of Gadavist. After 10
minutes inversion recovery sequences were used to assess for
infiltration and scar tissue.
CONTRAST:  10 cc  of Gadavist

[Series 4: [id number]_bh · axial · 8.0mm · 1.33mm/px · 1 of 15 slices shown (1 of 2)]
[im 1/15]
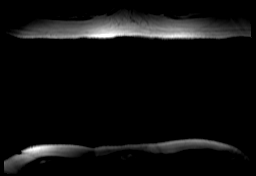

[Series 5: t2_haste_db_tra_bh · axial · 8.0mm · 1.56mm/px · 1 of 16 slices shown (2 of 2)]
[im 1/16]
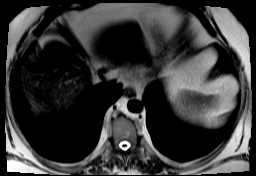

[Series 14: bSSFP · oblique · 8.0mm · 1.79mm/px · 1 of 25 slices shown (1 of 33)]
[im 1/25]
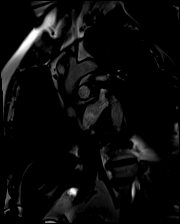

[Series 15: bSSFP · oblique · 8.0mm · 1.79mm/px · 1 of 25 slices shown (2 of 33)]
[im 1/25]
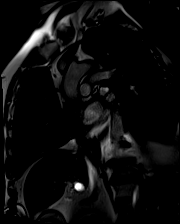

[Series 16: bSSFP · oblique · 8.0mm · 1.79mm/px · 1 of 25 slices shown (3 of 33)]
[im 1/25]
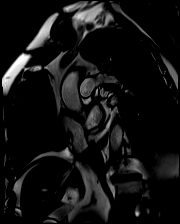

[Series 17: bSSFP · oblique · 8.0mm · 1.79mm/px · 1 of 25 slices shown (4 of 33)]
[im 1/25]
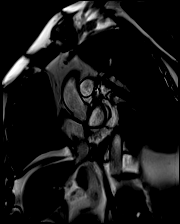

[Series 18: bSSFP · oblique · 8.0mm · 1.79mm/px · 1 of 25 slices shown (5 of 33)]
[im 1/25]
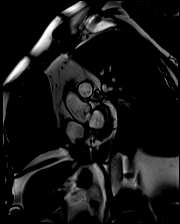

[Series 19: bSSFP · oblique · 8.0mm · 1.79mm/px · 1 of 25 slices shown (6 of 33)]
[im 1/25]
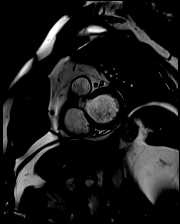

[Series 20: bSSFP · oblique · 8.0mm · 1.79mm/px · 1 of 25 slices shown (7 of 33)]
[im 1/25]
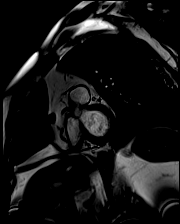

[Series 21: bSSFP · oblique · 8.0mm · 1.79mm/px · 1 of 25 slices shown (8 of 33)]
[im 1/25]
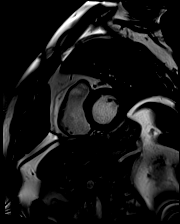

[Series 22: bSSFP · oblique · 8.0mm · 1.79mm/px · 1 of 25 slices shown (9 of 33)]
[im 1/25]
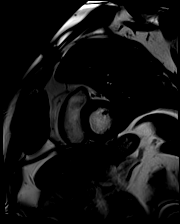

[Series 23: bSSFP · oblique · 8.0mm · 1.79mm/px · 1 of 25 slices shown (10 of 33)]
[im 1/25]
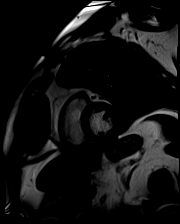

[Series 24: bSSFP · oblique · 8.0mm · 1.79mm/px · 1 of 25 slices shown (11 of 33)]
[im 1/25]
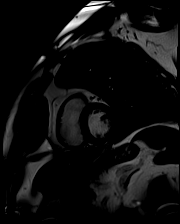

[Series 25: bSSFP · oblique · 8.0mm · 1.79mm/px · 1 of 25 slices shown (12 of 33)]
[im 1/25]
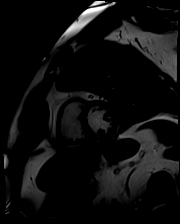

[Series 26: bSSFP · oblique · 8.0mm · 1.79mm/px · 1 of 25 slices shown (13 of 33)]
[im 1/25]
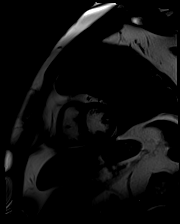

[Series 27: bSSFP · oblique · 8.0mm · 1.79mm/px · 1 of 25 slices shown (14 of 33)]
[im 1/25]
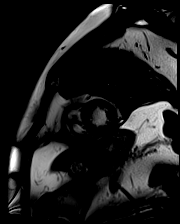

[Series 28: bSSFP · oblique · 8.0mm · 1.79mm/px · 1 of 25 slices shown (15 of 33)]
[im 1/25]
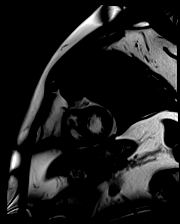

[Series 29: bSSFP · oblique · 8.0mm · 1.79mm/px · 1 of 25 slices shown (16 of 33)]
[im 1/25]
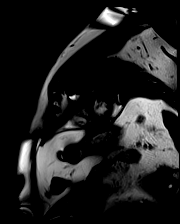

[Series 30: bSSFP · oblique · 8.0mm · 1.79mm/px · 1 of 25 slices shown (17 of 33)]
[im 1/25]
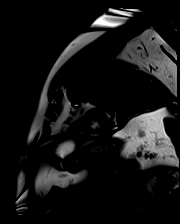

[Series 31: bSSFP · oblique · 8.0mm · 1.79mm/px · 1 of 25 slices shown (18 of 33)]
[im 1/25]
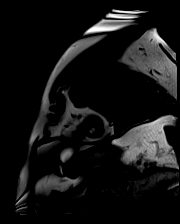

[Series 32: bSSFP · oblique · 8.0mm · 1.79mm/px · 1 of 25 slices shown (19 of 33)]
[im 1/25]
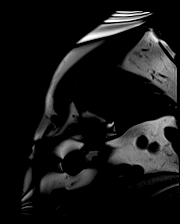

[Series 33: bSSFP · oblique · 8.0mm · 1.79mm/px · 1 of 25 slices shown (20 of 33)]
[im 1/25]
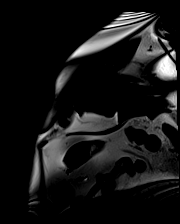

[Series 34: STIR · oblique · 8.0mm · 1.92mm/px · 1 of 19 slices shown]
[im 1/19]
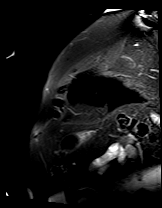

[Series 35: pseudo 3 chamber · oblique · 6.0mm · 1.79mm/px · 1 of 25 slices shown (1 of 7)]
[im 1/25]
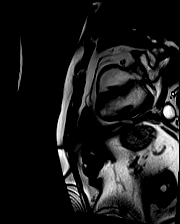

[Series 35: pseudo 3 chamber · oblique · 6.0mm · 1.79mm/px · 1 of 25 slices shown (2 of 7)]
[im 1/25]
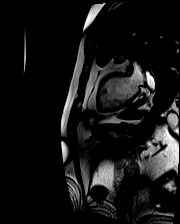

[Series 35: pseudo 3 chamber · oblique · 6.0mm · 1.79mm/px · 1 of 25 slices shown (3 of 7)]
[im 1/25]
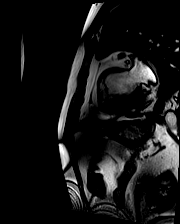

[Series 35: pseudo 3 chamber · oblique · 6.0mm · 1.79mm/px · 1 of 25 slices shown (4 of 7)]
[im 1/25]
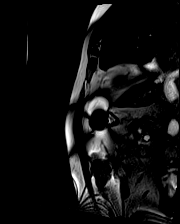

[Series 35: pseudo 3 chamber · oblique · 6.0mm · 1.79mm/px · 1 of 25 slices shown (5 of 7)]
[im 1/25]
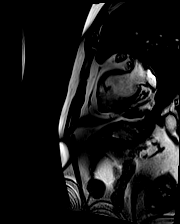

[Series 35: pseudo 3 chamber · oblique · 6.0mm · 1.79mm/px · 1 of 25 slices shown (6 of 7)]
[im 1/25]
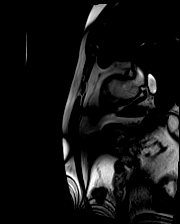

[Series 35: pseudo 3 chamber · oblique · 6.0mm · 1.79mm/px · 1 of 25 slices shown (7 of 7)]
[im 1/25]
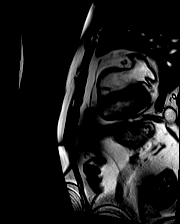

[Series 36: bSSFP · oblique · 6.0mm · 1.41mm/px · 1 of 25 slices shown (21 of 33)]
[im 1/25]
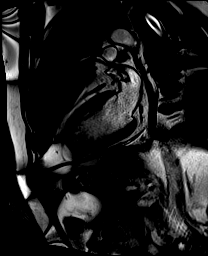

[Series 37: bSSFP · axial · 6.0mm · 1.41mm/px · 1 of 25 slices shown (22 of 33)]
[im 1/25]
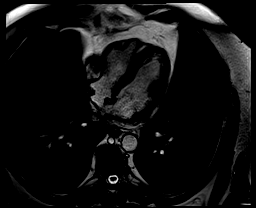

[Series 38: bSSFP · oblique · 6.0mm · 1.41mm/px · 1 of 25 slices shown (23 of 33)]
[im 1/25]
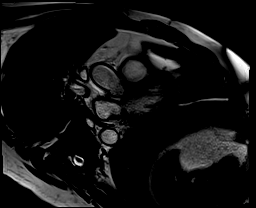

[Series 39: bSSFP · axial · 6.0mm · 1.41mm/px · 1 of 25 slices shown (24 of 33)]
[im 1/25]
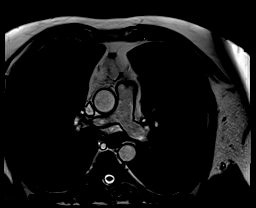

[Series 40: bSSFP · axial · 6.0mm · 1.41mm/px · 1 of 25 slices shown (25 of 33)]
[im 1/25]
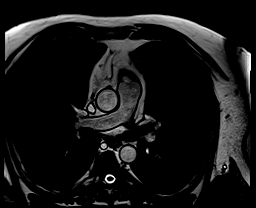

[Series 41: bSSFP · axial · 6.0mm · 1.41mm/px · 1 of 25 slices shown (26 of 33)]
[im 1/25]
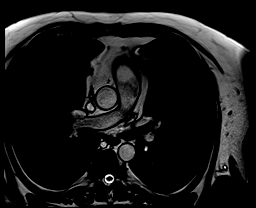

[Series 42: bSSFP · axial · 6.0mm · 1.41mm/px · 1 of 25 slices shown (27 of 33)]
[im 1/25]
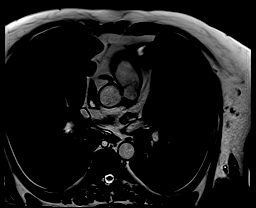

[Series 43: bSSFP · axial · 6.0mm · 1.41mm/px · 1 of 25 slices shown (28 of 33)]
[im 1/25]
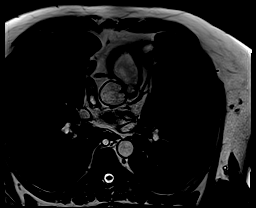

[Series 44: bSSFP · axial · 6.0mm · 1.41mm/px · 1 of 25 slices shown (29 of 33)]
[im 1/25]
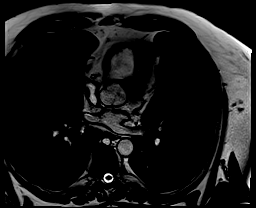

[Series 45: bSSFP · axial · 6.0mm · 1.41mm/px · 1 of 25 slices shown (30 of 33)]
[im 1/25]
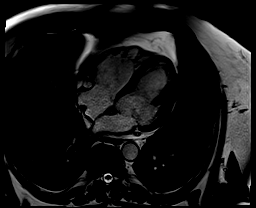

[Series 46: bSSFP · axial · 6.0mm · 1.41mm/px · 1 of 25 slices shown (31 of 33)]
[im 1/25]
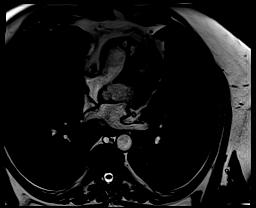

[Series 47: bSSFP · axial · 6.0mm · 1.41mm/px · 1 of 25 slices shown (32 of 33)]
[im 1/25]
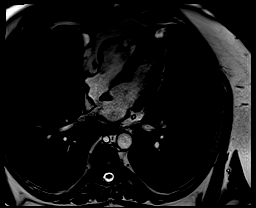

[Series 48: bSSFP · axial · 6.0mm · 1.41mm/px · 1 of 25 slices shown (33 of 33)]
[im 1/25]
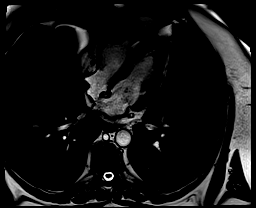

[43 of 48 positions shown; findings below may reference images not displayed]

FINDINGS: Left ventricle:

-Normal size

-Normal systolic function

-No LGE

LV EF: 62% (Normal 56-78%)

Absolute volumes:

LV EDV: 151mL (Normal 77-195 mL)

LV ESV: 57mL (Normal 19-72 mL)

LV SV: 94mL (Normal 51-133 mL)

CO: 5.8L/min (Normal 2.8-8.8 L/min)

Indexed volumes:

LV EDV: 52mL/sq-m (Normal 47-92 mL/sq-m)

LV ESV: 19mL/sq-m (Normal 13-30 mL/sq-m)

LV SV: 32mL/sq-m (Normal 32-62 mL/sq-m)

CI: 2.0L/min/sq-m (Normal 1.7-4.2 L/min/sq-m)

Right ventricle: Normal size and systolic function

RV EF:  56% (Normal 47-74%)

Absolute volumes:

RV EDV: 155mL (Normal 88-227 mL)

RV ESV: 68mL (Normal 23-103 mL)

RV SV: 88mL (Normal 52-138 mL)

CO: 5.4L/min (Normal 2.8-8.8 L/min)

Indexed volumes:

RV EDV: 53mL/sq-m (Normal 55-105 mL/sq-m)

RV ESV: 23mL/sq-m (Normal 15-43 mL/sq-m)

RV SV: 30mL/sq-m (Normal 32-64 mL/sq-m)

CI: 1.8L/min/sq-m (Normal 1.7-4.2 L/min/sq-m)

Left atrium: Normal size

Right atrium: Normal size

Mitral valve: No regurgitation

Aortic valve: Tricuspid.  No regurgitation

Tricuspid valve: No regurgitation

Pulmonic valve: No regurgitation

Aorta: Normal proximal ascending aorta

Pericardium: Mass adjacent to right atrium measuring 2.2 cm x 1.7 cm
x 2.4 cm. Unchanged in size from prior DEONTAE on 02/15/17, measured
cm x 1.7cm x 2.5cm. Mass is hyperintense on T2, isointense on T1,
does not suppress with fat saturation, and no late gadolinium
enhancement. This is consistent with pericardial cyst
IMPRESSION: 1. Mass adjacent to right atrium measures 2.2 cm x 1.7 cm x 2.4 cm,
unchanged in size from prior cardiac MRI on 02/15/17 (measured 2.2 cm
x 1.7 cm x 2.5 cm). Mass is hyperintense on T2 weighted imaging,
isointense on T1 weighted imaging, does not suppress with fat
saturation, and no late gadolinium enhancement. This is consistent
with pericardial cyst

2.  Normal LV size and systolic function (EF 62%)

3.  Normal RV size and systolic function (EF 56%)

4.  No LGE to suggest myocardial scar

## 2022-04-19 ENCOUNTER — Encounter: Payer: Self-pay | Admitting: Podiatry

## 2022-04-19 ENCOUNTER — Ambulatory Visit (INDEPENDENT_AMBULATORY_CARE_PROVIDER_SITE_OTHER): Payer: BC Managed Care – PPO

## 2022-04-19 ENCOUNTER — Ambulatory Visit: Payer: BC Managed Care – PPO | Admitting: Podiatry

## 2022-04-19 DIAGNOSIS — T1490XA Injury, unspecified, initial encounter: Secondary | ICD-10-CM

## 2022-04-19 DIAGNOSIS — S92514A Nondisplaced fracture of proximal phalanx of right lesser toe(s), initial encounter for closed fracture: Secondary | ICD-10-CM

## 2022-04-19 DIAGNOSIS — L97511 Non-pressure chronic ulcer of other part of right foot limited to breakdown of skin: Secondary | ICD-10-CM

## 2022-04-19 MED ORDER — MUPIROCIN 2 % EX OINT: 1.0000 | TOPICAL_OINTMENT | Freq: Two times a day (BID) | CUTANEOUS | 2 refills | Status: AC

## 2022-04-19 NOTE — Progress Notes (Signed)
Subjective:   Patient ID: Miguel Hamilton, male   DOB: 66 y.o.   MRN: [id number]   HPI 66 year old male presents the office today for concerns of a blister, wound on his right foot.  Patient reports that last Thursday he went to the bathroom and hit his toe on a chair.  He has some bruising to the top of his foot as well as a blister.  Keep the bandage on he follow-up with his primary care doctor. He saw his PCP yesterday and was started on doxycycline 100 mg p.o. twice a day for 10 days.  Review of Systems  All other systems reviewed and are negative.  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   Last Assessment & Plan:  Relevant Hx: Course: Daily Update: Today's Plan:this has been stable, no recent flares will cont with allopurinol  Electronically signed by: Jenelle Mages, FNP 12/28/15 1133   CKD (chronic kidney disease) stage 1, GFR 90 ml/min or greater 12/26/2015   Last Assessment & Plan:  This has been stable for him overall and will follow   Coronary artery calcification seen on CAT scan 01/13/2017   Dysfunctional gallbladder 01/13/2017   Elevated liver function tests 01/17/2017   Elevated WBC count 12/26/2015   Enlarged prostate 12/26/2015   Last Assessment & Plan:  He is going to have PSA drawn to evaluate trend for him he has history of prostatitis about 2 years ago but none since   Erosive esophagitis 01/13/2017   Essential hypertension 12/26/2015   Last Assessment & Plan:  On repeat this was improved for him will follow   Gastritis without bleeding 01/13/2017   High risk medication use 12/28/2015   Malaise and fatigue 12/26/2015   Last Assessment & Plan:  Off and on for him and mostly tied to the fact that he is more sedentary  than he needs to be   Mixed hyperlipidemia 12/26/2015   Last Assessment & Plan:  Update his labs for him fasting, he is aware he needs to exercise to improve this   Morbid obesity (HCC) 01/17/2017   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   Peripheral neuropathy 12/26/2015   Last Assessment & Plan:  Stable for him at this time   Primary osteoarthritis involving multiple joints 12/26/2015   Last Assessment & Plan:  Shoulder is bothering him to the right side now as he fell on this about 2 mnths ago at work and he states he is not wanting to see ortho yet for this but realizes he may be doing this soon, his other joints are fair for him   Spinal stenosis of lumbar region 09/03/2012   Last Assessment & Plan:  This is stable for him follow, he has numbness to the right lower leg with periodic falls related to this, 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    Past Surgical History:  Procedure Laterality Date   BACK SURGERY     1982   CARPAL TUNNEL RELEASE     right    CATARACT EXTRACTION W/ INTRAOCULAR LENS  IMPLANT, BILATERAL     KNEE ARTHROSCOPY     left    LUMBAR LAMINECTOMY/DECOMPRESSION MICRODISCECTOMY  09/03/2012   Procedure: LUMBAR LAMINECTOMY/DECOMPRESSION MICRODISCECTOMY 1 LEVEL;  Surgeon: Temple Pacini, MD;  Location: [facility];  Service: Neurosurgery;  Laterality: N/A;  lumbar three-four laminectomy decompression microdiscectomy   TONSILLECTOMY     WRIST SURGERY     left      Current Outpatient Medications:    mupirocin ointment (BACTROBAN) 2 %, Apply 1 Application topically 2 (two) times daily., Disp: 30 g, Rfl: 2   albuterol (VENTOLIN HFA) 108 (90 Base) MCG/ACT inhaler, Inhale 1 puff into the lungs as needed., Disp: , Rfl:    allopurinol (ZYLOPRIM) 300 MG tablet, Take 300 mg by mouth daily. , Disp: , Rfl:    aspirin EC 81 MG tablet, Take 1 tablet (81 mg total) by mouth daily., Disp: 90 tablet, Rfl: 3   dorzolamide-timolol (COSOPT) 22.3-6.8 MG/ML ophthalmic solution,  Place 1 drop into the right eye 2 (two) times daily., Disp: , Rfl:    furosemide (LASIX) 20 MG tablet, Take 1 tablet by mouth daily. , Disp: , Rfl:    lisinopril (PRINIVIL,ZESTRIL) 20 MG tablet, Take 1 tablet (20 mg total) by mouth daily., Disp: 90 tablet, Rfl: 1   lisinopril (ZESTRIL) 10 MG tablet, Take 10 mg by mouth 2 (two) times daily., Disp: , Rfl:    nitroGLYCERIN (NITROSTAT) 0.4 MG SL tablet, Place 1 tablet under the tongue every 5 (five) minutes as needed., Disp: , Rfl:    rosuvastatin (CRESTOR) 5 MG tablet, Take 5 mg by mouth daily., Disp: , Rfl:   Allergies  Allergen Reactions   Ketorolac     unknown   Oxycodone-Acetaminophen Hives    Other reaction(s): HIVES   Percocet [Oxycodone-Acetaminophen] Hives   Sulfa Antibiotics     unknown   Sulfacetamide Sodium     unknown           Objective:  Physical Exam  General: AAO x3, NAD  Dermatological: Superficial ulceration noted on the right foot along the second digit proximal to the MPJ this is where there was a blister.  There is no probing, amount or tunneling.  There is localized edema and erythema present.  There is no fluctuation or crepitation but there is no malodor.  There is bruising present to the digits.     Vascular: Dorsalis Pedis artery and Posterior Tibial artery pedal pulses are palpable bilateral with immedate capillary fill time. There is no pain with calf compression, swelling, warmth, erythema.   Neruologic: Grossly intact via light touch bilateral.  Musculoskeletal: Diffuse tenderness of the forefoot but the majority tenderness is localized along the area of the ulcer.  Muscular strength 5/5 in all groups tested bilateral.      Assessment:   Ulceration right foot, second toe fracture     Plan:  -Treatment options discussed including all alternatives, risks, and complications -Etiology of symptoms were discussed -X-rays were obtained and reviewed with the patient.  3 views of the right foot were  obtained.  Fracture evident along the second digit. -I did lightly debride some of the tissue today but there is quite a bit of discomfort.  Continue with doxycycline.  Prescribing Pearson to apply daily.  Surgical shoe was dispensed for offloading.  Elevation.  Limit activity.  Return in about 10 days (around 04/29/2022) for right foot ulcer.  Vivi Barrack DPM

## 2022-04-29 ENCOUNTER — Encounter: Payer: Self-pay | Admitting: Podiatry

## 2022-04-29 ENCOUNTER — Ambulatory Visit: Payer: BC Managed Care – PPO | Admitting: Podiatry

## 2022-04-29 DIAGNOSIS — L03115 Cellulitis of right lower limb: Secondary | ICD-10-CM

## 2022-04-29 DIAGNOSIS — Z79899 Other long term (current) drug therapy: Secondary | ICD-10-CM | POA: Diagnosis not present

## 2022-04-29 DIAGNOSIS — B351 Tinea unguium: Secondary | ICD-10-CM

## 2022-04-29 DIAGNOSIS — L97511 Non-pressure chronic ulcer of other part of right foot limited to breakdown of skin: Secondary | ICD-10-CM

## 2022-04-29 MED ORDER — TERBINAFINE HCL 250 MG PO TABS: 250.0000 mg | ORAL_TABLET | Freq: Every day | ORAL | 0 refills | Status: AC

## 2022-04-29 NOTE — Progress Notes (Unsigned)
Subjective: Chief Complaint  Patient presents with   Foot Ulcer    Right foot 2nd toe ulcer, top of the foot, pt states he is doing much better, pt denies any pain, pt is back to wearing a reg shoe    66 year old male presents for the above complaint states it is doing a lot better. He is back in a shoe and it is feeling a lot better.  Also asked about toenail fungus.  No pain in the nails nails are thickened discolored. Denies any systemic complaints such as fevers, chills, nausea, vomiting. No acute changes since last appointment, and no other complaints at this time.   Objective: AAO x3, NAD DP/PT pulses palpable bilaterally, CRT less than 3 seconds There is still some residual edema and erythema present to the second toe but appears to be much improved.  The area of the wound has scabbed over and is able to debride some of this today.  The picture below was prior to debridement.  There is no drainage or pus.  There is no fluctuation or crepitation.  There is no malodor.   Nails are hypertrophic, dystrophic with yellow, brown discoloration.  No edema, erythema or signs of infection. No pain with calf compression, swelling, warmth,    (It is not quite as red in person as the picture)  Assessment: Right foot resolving cellulitis, ulceration; toe fracture onychomycosis  Plan: -All treatment options discussed with the patient including all alternatives, risks, complications.  -Sharp debride some of the loose tissue to any complications or bleeding.  Recommend antibiotic ointment dressing changes daily.  Continue offloading, elevation. -Note provided to return to work -[employer] given the toe fracture. -Discussed treatment options for nail fungus including oral, topical as well as alternative treatments.  Was to do oral Lamisil.  Discussed effects.  We will check a CBC and LFT prior to starting the medication. -Monitor for any clinical signs or symptoms of infection and directed  to call the office immediately should any occur or go to the ER. -Patient encouraged to call the office with any questions, concerns, change in symptoms.   Vivi Barrack DPM

## 2022-04-29 NOTE — Patient Instructions (Signed)
Terbinafine Tablets What is this medication? TERBINAFINE (TER bin a feen) treats fungal infections of the nails. It belongs to a group of medications called antifungals. It will not treat infections caused by bacteria or viruses. This medicine may be used for other purposes; ask your health care provider or pharmacist if you have questions. COMMON BRAND NAME(S): Lamisil, Terbinex What should I tell my care team before I take this medication? They need to know if you have any of these conditions: Liver disease An unusual or allergic reaction to terbinafine, other medications, foods, dyes, or preservatives Pregnant or trying to get pregnant Breast-feeding How should I use this medication? Take this medication by mouth with water. Take it as directed on the prescription label at the same time every day. You can take it with or without food. If it upsets your stomach, take it with food. Keep taking it unless your care team tells you to stop. A special MedGuide will be given to you by the pharmacist with each prescription and refill. Be sure to read this information carefully each time. Talk to your care team regarding the use of this medication in children. Special care may be needed. Overdosage: If you think you have taken too much of this medicine contact a poison control center or emergency room at once. NOTE: This medicine is only for you. Do not share this medicine with others. What if I miss a dose? If you miss a dose, take it as soon as you can unless it is more than 4 hours late. If it is more than 4 hours late, skip the missed dose. Take the next dose at the normal time. What may interact with this medication? Do not take this medication with any of the following: Pimozide Thioridazine This medication may also interact with the following: Beta blockers Caffeine Certain medications for mental health conditions Cimetidine Cyclosporine Medications for fungal infections like fluconazole  and ketoconazole Medications for irregular heartbeat like amiodarone, flecainide and propafenone Rifampin Warfarin This list may not describe all possible interactions. Give your health care provider a list of all the medicines, herbs, non-prescription drugs, or dietary supplements you use. Also tell them if you smoke, drink alcohol, or use illegal drugs. Some items may interact with your medicine. What should I watch for while using this medication? Visit your care team for regular checks on your progress. You may need blood work while you are taking this medication. It may be some time before you see the benefit from this medication. This medication may cause serious skin reactions. They can happen weeks to months after starting the medication. Contact your care team right away if you notice fevers or flu-like symptoms with a rash. The rash may be red or purple and then turn into blisters or peeling of the skin. Or, you might notice a red rash with swelling of the face, lips or lymph nodes in your neck or under your arms. This medication can make you more sensitive to the sun. Keep out of the sun, If you cannot avoid being in the sun, wear protective clothing and sunscreen. Do not use sun lamps or tanning beds/booths. What side effects may I notice from receiving this medication? Side effects that you should report to your care team as soon as possible: Allergic reactions--skin rash, itching, hives, swelling of the face, lips, tongue, or throat Change in sense of smell Change in taste Infection--fever, chills, cough, or sore throat Liver injury--right upper belly pain, loss of appetite, nausea,   light-colored stool, dark yellow or brown urine, yellowing skin or eyes, unusual weakness or fatigue Low red blood cell level--unusual weakness or fatigue, dizziness, headache, trouble breathing Lupus-like syndrome--joint pain, swelling, or stiffness, butterfly-shaped rash on the face, rashes that get worse  in the sun, fever, unusual weakness or fatigue Rash, fever, and swollen lymph nodes Redness, blistering, peeling, or loosening of the skin, including inside the mouth Unusual bruising or bleeding Worsening mood, feelings of depression Side effects that usually do not require medical attention (report to your care team if they continue or are bothersome): Diarrhea Gas Headache Nausea Stomach pain Upset stomach This list may not describe all possible side effects. Call your doctor for medical advice about side effects. You may report side effects to FDA at 1-800-FDA-1088. Where should I keep my medication? Keep out of the reach of children and pets. Store between 20 and 25 degrees C (68 and 77 degrees F). Protect from light. Get rid of any unused medication after the expiration date. To get rid of medications that are no longer needed or have expired: Take the medication to a medication take-back program. Check with your pharmacy or law enforcement to find a location. If you cannot return the medication, check the label or package insert to see if the medication should be thrown out in the garbage or flushed down the toilet. If you are not sure, ask your care team. If it is safe to put it in the trash, take the medication out of the container. Mix the medication with cat litter, dirt, coffee grounds, or other unwanted substance. Seal the mixture in a bag or container. Put it in the trash. NOTE: This sheet is a summary. It may not cover all possible information. If you have questions about this medicine, talk to your doctor, pharmacist, or health care provider.  2023 Elsevier/Gold Standard (2021-05-12 00:00:00)  

## 2022-05-20 ENCOUNTER — Ambulatory Visit: Payer: BC Managed Care – PPO | Admitting: Podiatry
